# Patient Record
Sex: Female | Born: 1970 | Race: White | Hispanic: No | Marital: Married | Smoking: Never smoker
Health system: Southern US, Community
[De-identification: ages and names within clinical notes are randomized; demographics above are authoritative.]

## PROBLEM LIST (undated history)

## (undated) ENCOUNTER — Inpatient Hospital Stay (HOSPITAL_COMMUNITY): Payer: Self-pay

## (undated) DIAGNOSIS — E669 Obesity, unspecified: Secondary | ICD-10-CM

## (undated) DIAGNOSIS — E069 Thyroiditis, unspecified: Secondary | ICD-10-CM

## (undated) DIAGNOSIS — I10 Essential (primary) hypertension: Secondary | ICD-10-CM

## (undated) DIAGNOSIS — E039 Hypothyroidism, unspecified: Secondary | ICD-10-CM

## (undated) DIAGNOSIS — I493 Ventricular premature depolarization: Secondary | ICD-10-CM

## (undated) DIAGNOSIS — E119 Type 2 diabetes mellitus without complications: Secondary | ICD-10-CM

## (undated) DIAGNOSIS — E282 Polycystic ovarian syndrome: Secondary | ICD-10-CM

## (undated) DIAGNOSIS — I499 Cardiac arrhythmia, unspecified: Secondary | ICD-10-CM

## (undated) DIAGNOSIS — N979 Female infertility, unspecified: Secondary | ICD-10-CM

## (undated) HISTORY — DX: Hypothyroidism, unspecified: E03.9

## (undated) HISTORY — DX: Type 2 diabetes mellitus without complications: E11.9

## (undated) HISTORY — DX: Obesity, unspecified: E66.9

## (undated) HISTORY — PX: WISDOM TOOTH EXTRACTION: SHX21

## (undated) HISTORY — DX: Polycystic ovarian syndrome: E28.2

## (undated) HISTORY — PX: TONSILLECTOMY AND ADENOIDECTOMY: SUR1326

## (undated) HISTORY — DX: Thyroiditis, unspecified: E06.9

## (undated) HISTORY — PX: TUBAL LIGATION: SHX77

## (undated) HISTORY — DX: Female infertility, unspecified: N97.9

## (undated) HISTORY — DX: Cardiac arrhythmia, unspecified: I49.9

## (undated) HISTORY — DX: Essential (primary) hypertension: I10

## (undated) HISTORY — DX: Ventricular premature depolarization: I49.3

---

## 1998-11-13 ENCOUNTER — Encounter: Payer: Self-pay | Admitting: Family Medicine

## 1998-11-13 ENCOUNTER — Ambulatory Visit (HOSPITAL_COMMUNITY): Admission: RE | Admit: 1998-11-13 | Discharge: 1998-11-13 | Payer: Self-pay | Admitting: Family Medicine

## 2003-01-23 ENCOUNTER — Encounter: Payer: Self-pay | Admitting: Family Medicine

## 2003-01-23 ENCOUNTER — Ambulatory Visit (HOSPITAL_COMMUNITY): Admission: RE | Admit: 2003-01-23 | Discharge: 2003-01-23 | Payer: Self-pay | Admitting: Family Medicine

## 2003-07-31 ENCOUNTER — Other Ambulatory Visit: Admission: RE | Admit: 2003-07-31 | Discharge: 2003-07-31 | Payer: Self-pay | Admitting: *Deleted

## 2004-12-22 ENCOUNTER — Emergency Department (HOSPITAL_COMMUNITY): Admission: EM | Admit: 2004-12-22 | Discharge: 2004-12-22 | Payer: Self-pay | Admitting: Emergency Medicine

## 2004-12-26 ENCOUNTER — Ambulatory Visit: Payer: Self-pay | Admitting: Cardiology

## 2005-02-06 ENCOUNTER — Ambulatory Visit: Payer: Self-pay | Admitting: Cardiology

## 2012-04-28 ENCOUNTER — Other Ambulatory Visit: Payer: Self-pay | Admitting: Obstetrics and Gynecology

## 2012-05-04 NOTE — Telephone Encounter (Signed)
Lm on vm for pt.to cb °

## 2012-05-11 ENCOUNTER — Telehealth: Payer: Self-pay | Admitting: Obstetrics and Gynecology

## 2012-05-12 ENCOUNTER — Telehealth: Payer: Self-pay | Admitting: Obstetrics and Gynecology

## 2012-05-12 NOTE — Telephone Encounter (Signed)
TC REGARDING MESSAGE. PT STATES THAT SHE IS LATE FOR CYCLE X 4 DAYS AND PT DID TAKE UPT  AND IT WAS NEG.PT  IS ON PROGESTERONE AND WANT TO KNOW WHAT TO DO. PT HAS APPT WITH SR ON 05/20/12. SPOKE WITH LD  AND PER SR INSTRUCTIONS ARE FOR PT TO WAIT 3 WEEKS AND START RX PROGESTERONE OVER AGAIN. PT IS INSTRUCTED TO KEEP APPT WITH SR AND IF PT HAVE ANY OTHER PROBLEMS TO  GIVE [place] A CALL BACK. PT VOICED UNDERSTANDING.

## 2012-05-20 ENCOUNTER — Ambulatory Visit (INDEPENDENT_AMBULATORY_CARE_PROVIDER_SITE_OTHER): Payer: BC Managed Care – HMO | Admitting: Obstetrics and Gynecology

## 2012-05-20 ENCOUNTER — Encounter: Payer: Self-pay | Admitting: Obstetrics and Gynecology

## 2012-05-20 VITALS — BP 120/76 | Wt 290.0 lb

## 2012-05-20 DIAGNOSIS — N949 Unspecified condition associated with female genital organs and menstrual cycle: Secondary | ICD-10-CM

## 2012-05-20 DIAGNOSIS — N938 Other specified abnormal uterine and vaginal bleeding: Secondary | ICD-10-CM

## 2012-05-20 LAB — FOLLICLE STIMULATING HORMONE: FSH: 3.6 m[IU]/mL

## 2012-05-20 NOTE — Progress Notes (Signed)
Subjective:    Jordan Reeves is a 41 y.o. female, G3P0030, who presents for infertility .Known PCOS with insulin resistance.Had a menstrual delay to 42 days for the first time since using Prometrium 200 mg D16-25 with negative UPT. Has had a change in meds for thyroid without a follow-up. Would like to pursue Clomid induction in January ( 2 previous conceptions with 150 mg with early sab: progesterone level?)    The following portions of the patient's history were reviewed and updated as appropriate: allergies, current medications, past family history.  Review of Systems Pertinent items are noted in HPI.   Objective:    BP 120/76  Wt 290 lb (131.543 kg)  LMP 05/18/2012    Weight:  Wt Readings from Last 1 Encounters:  05/20/12 290 lb (131.543 kg)          BMI: There is no height on file to calculate BMI.  General Appearance: Alert, appropriate appearance for age. No acute distress GYN exam: deferred   Assessment:    Dysfunctional uterine bleeding  in pt known with PCOS   Plan:    Thyroid panel and FSH. Resume Prometrium as prescribed. Follow-up 08/2012     Freida Busman JMD

## 2012-05-21 LAB — THYROID PANEL WITH TSH
Free Thyroxine Index: 4.1 — ABNORMAL HIGH (ref 1.0–3.9)
T3 Uptake: 29.6 % (ref 22.5–37.0)
T4, Total: 13.7 ug/dL — ABNORMAL HIGH (ref 5.0–12.5)
TSH: 2.164 u[IU]/mL (ref 0.350–4.500)

## 2012-05-21 LAB — T4, FREE: Free T4: 1.59 ng/dL (ref 0.80–1.80)

## 2012-10-09 ENCOUNTER — Other Ambulatory Visit: Payer: Self-pay

## 2013-06-30 ENCOUNTER — Other Ambulatory Visit: Payer: Self-pay | Admitting: Obstetrics and Gynecology

## 2013-06-30 ENCOUNTER — Other Ambulatory Visit: Payer: Self-pay

## 2013-06-30 DIAGNOSIS — Z1231 Encounter for screening mammogram for malignant neoplasm of breast: Secondary | ICD-10-CM

## 2013-07-27 ENCOUNTER — Ambulatory Visit
Admission: RE | Admit: 2013-07-27 | Discharge: 2013-07-27 | Disposition: A | Discharge status: Home / Self Care | Payer: BC Managed Care – HMO | Source: Ambulatory Visit | Attending: Obstetrics and Gynecology | Admitting: Obstetrics and Gynecology

## 2013-07-27 DIAGNOSIS — Z1231 Encounter for screening mammogram for malignant neoplasm of breast: Secondary | ICD-10-CM

## 2013-07-29 ENCOUNTER — Other Ambulatory Visit: Payer: Self-pay | Admitting: Obstetrics and Gynecology

## 2013-07-29 DIAGNOSIS — N63 Unspecified lump in unspecified breast: Secondary | ICD-10-CM

## 2013-09-09 ENCOUNTER — Encounter: Payer: Self-pay | Admitting: *Deleted

## 2013-09-09 ENCOUNTER — Encounter: Payer: BC Managed Care – HMO | Attending: Obstetrics and Gynecology | Admitting: *Deleted

## 2013-09-09 DIAGNOSIS — E669 Obesity, unspecified: Secondary | ICD-10-CM

## 2013-09-09 DIAGNOSIS — E282 Polycystic ovarian syndrome: Secondary | ICD-10-CM

## 2013-09-09 DIAGNOSIS — IMO0001 Reserved for inherently not codable concepts without codable children: Secondary | ICD-10-CM | POA: Insufficient documentation

## 2013-09-09 DIAGNOSIS — Z713 Dietary counseling and surveillance: Secondary | ICD-10-CM | POA: Insufficient documentation

## 2013-09-09 DIAGNOSIS — E1165 Type 2 diabetes mellitus with hyperglycemia: Secondary | ICD-10-CM

## 2013-09-09 DIAGNOSIS — E119 Type 2 diabetes mellitus without complications: Secondary | ICD-10-CM

## 2013-09-09 NOTE — Progress Notes (Signed)
  Medical Nutrition Therapy:  Appt start time: 1100 end time:  1200.   Assessment:  Primary concerns today: Jordan Reeves is here for nutrition counseling pertaining to PCOS and diabetes.  Her fertility specialist has recommended a HgAc1 of 6% for fertility treatment.  She has an appointment with an endocrinologist on Monday (Dr. Chalmers Cater).  Her current HgA1c is 7.3%   Preferred Learning Style:   Auditory  Visual   Learning Readiness:   Contemplating   MEDICATIONS: metformin 1000 mg   DIETARY INTAKE:  Usual eating pattern includes 3 meals and 1-2 snacks per day.  Everyday foods include proteins, complex carbohydrates.  Avoided foods include fish, isn't really into vegetables either.    24-hr recall:  B ( AM): 2 oz kefir.  Usually skips  Snk ( AM): not usually  L ( PM): sandwich ([place] with cheese sometimes) oat bread Snk ( PM): apple or clementine D ( PM): chicken, small serving of green beans and more mashed potato.  Is trying to limit breads Snk ( PM): not usually.  Maybe yogurt or cottage cheese with spoonful preserves Beverages: water, coffee sometimes  Usual physical activity: none currently  Estimated energy needs: 1800 calories 200 g carbohydrates 135 g protein 50 g fat  Progress Towards Goal(s):  In progress.   Nutritional Diagnosis:  -2.1 Inpaired nutrition utilization As related to carbohydrates.  As evidenced by uncontrolled diabetes and PCOS.    Intervention:  Nutrition counseling provided.   States why dietary management is important in controlling blood glucose  Describes the effects each nutrient has on blood glucose levels  Demonstrates ability to create a balanced meal plan  Demonstrates carbohydrate counting   States when to check blood glucose levels  States the effect of stress and exercise on blood glucose levels  Aim for 2-3 carb choices/meal.  Increasing vegetable consumption per MyPlate recommendations.  Avoid sugar and concentrated  sweets  Teaching Method Utilized:  Visual Auditory  Handouts given during visit include: Living Well with Diabetes Carb Counting and Food Label handouts Meal Plan Card   Barriers to learning/adherence to lifestyle change: willingness to comply (give up honey and increase vegetables)  Demonstrated degree of understanding via:  Teach Back   Monitoring/Evaluation:  Dietary intake, exercise, BGM, and body weight in 6-8 week(s).

## 2013-09-12 ENCOUNTER — Other Ambulatory Visit: Payer: Self-pay | Admitting: Endocrinology

## 2013-09-12 DIAGNOSIS — E010 Iodine-deficiency related diffuse (endemic) goiter: Secondary | ICD-10-CM

## 2013-09-15 ENCOUNTER — Ambulatory Visit
Admission: RE | Admit: 2013-09-15 | Discharge: 2013-09-15 | Disposition: A | Discharge status: Home / Self Care | Payer: BC Managed Care – HMO | Source: Ambulatory Visit | Attending: Endocrinology | Admitting: Endocrinology

## 2013-09-15 DIAGNOSIS — E010 Iodine-deficiency related diffuse (endemic) goiter: Secondary | ICD-10-CM

## 2013-09-20 ENCOUNTER — Ambulatory Visit
Admission: RE | Admit: 2013-09-20 | Discharge: 2013-09-20 | Disposition: A | Discharge status: Home / Self Care | Payer: BC Managed Care – HMO | Source: Ambulatory Visit | Attending: Obstetrics and Gynecology | Admitting: Obstetrics and Gynecology

## 2013-09-20 DIAGNOSIS — N63 Unspecified lump in unspecified breast: Secondary | ICD-10-CM

## 2013-11-04 ENCOUNTER — Ambulatory Visit: Payer: BC Managed Care – HMO | Admitting: *Deleted

## 2014-06-26 ENCOUNTER — Encounter: Payer: Self-pay | Admitting: *Deleted

## 2014-11-21 ENCOUNTER — Ambulatory Visit (INDEPENDENT_AMBULATORY_CARE_PROVIDER_SITE_OTHER): Payer: BLUE CROSS/BLUE SHIELD | Admitting: Cardiology

## 2014-11-21 ENCOUNTER — Encounter: Payer: Self-pay | Admitting: Cardiology

## 2014-11-21 VITALS — BP 154/98 | HR 61 | Ht 68.0 in | Wt 264.0 lb

## 2014-11-21 DIAGNOSIS — R002 Palpitations: Secondary | ICD-10-CM

## 2014-11-21 DIAGNOSIS — E069 Thyroiditis, unspecified: Secondary | ICD-10-CM | POA: Insufficient documentation

## 2014-11-21 NOTE — Progress Notes (Signed)
Cardiology Office Note   Date:  11/21/2014   ID:  Jordan Reeves, DOB 1971-02-27, MRN [id number]  PCP:  Pcp Not In System  Cardiologist:   Sueanne Margarita, MD   Chief Complaint  Patient presents with  . Palpitations      History of Present Illness: Jordan Reeves is a 44 y.o. female who presents for evaluation of palpitations.  She says that she was pregnant at the time and did not know that she was pregnant when she was having the palpitations.  She says that her BP would run up and down at 169/48mmHg and then would go down to 100/3mmHg.  Her heart beat would go up and down as well.  She would notice that her heart rate was skipping and her BP wrist monitor would read irregular.  She had a few dizzy spells but her sugar was out of wack and may have been due to the pregnancy. She was placed on progesterone about 3 weeks ago and her palpitations drastically improved within a day of going on progesterone. Unfortunately she had a miscarriage on Friday.      Past Medical History  Diagnosis Date  . Hypertension   . Herpes simplex without mention of complication   . Diabetes mellitus without complication   . PCOS (polycystic ovarian syndrome)   . Palpitations   . Infertility, female   . Thyroiditis     Hashimoto's now with hypothyroidism    Past Surgical History  Procedure Laterality Date  . Tonsillectomy and adenoidectomy    . Wisdom tooth extraction       Current Outpatient Prescriptions  Medication Sig Dispense Refill  . BD INSULIN SYRINGE ULTRAFINE 31G X 5/16" 0.3 ML MISC   6  . LEVOXYL 175 MCG tablet Take 175 mcg by mouth daily.  3  . metFORMIN (GLUCOPHAGE-XR) 500 MG 24 hr tablet Take 500 mg by mouth daily.  5  . metoprolol succinate (TOPROL-XL) 100 MG 24 hr tablet Take 100 mg by mouth daily. Pt states that she takes 1 tablet by mouth daily  3  . progesterone (PROMETRIUM) 200 MG capsule Take 200 mg by mouth daily.     No current facility-administered medications for this  visit.    Allergies:   Review of patient's allergies indicates no known allergies.    Social History:  The patient  reports that she has never smoked. She has never used smokeless tobacco. She reports that she does not drink alcohol or use illicit drugs.   Family History:  The patient's family history includes Breast cancer in her maternal aunt; Cervical cancer in her maternal aunt; Diabetes in her father and mother; Heart disease in her father, maternal grandfather, maternal grandmother, mother, and paternal grandfather; Hyperlipidemia in her mother; Hypertension in her mother; Lymphoma in her paternal grandmother.    ROS:  Please see the history of present illness.   Otherwise, review of systems are positive for none.   All other systems are reviewed and negative.    PHYSICAL EXAM: VS:  BP 154/98 mmHg  Pulse 61  Ht 5\' 8"  (1.727 m)  Wt 264 lb (119.75 kg)  BMI 40.15 kg/m2 , BMI Body mass index is 40.15 kg/(m^2). GEN: Well nourished, well developed, in no acute distress HEENT: normal Neck: no JVD, carotid bruits, or masses Cardiac: RRR; no murmurs, rubs, or gallops,no edema  Respiratory:  clear to auscultation bilaterally, normal work of breathing GI: soft, nontender, nondistended, + BS MS: no deformity or atrophy  Skin: warm and dry, no rash Neuro:  Strength and sensation are intact Psych: euthymic mood, full affect   EKG:  EKG is ordered today. The ekg ordered today demonstrates NSR with no ST changes   Recent Labs: No results found for requested labs within last 365 days.    Lipid Panel No results found for: CHOL, TRIG, HDL, CHOLHDL, VLDL, LDLCALC, LDLDIRECT    Wt Readings from Last 3 Encounters:  11/21/14 264 lb (119.75 kg)  05/20/12 290 lb (131.543 kg)        ASSESSMENT AND PLAN:  1.  Palptiations - I will get an event monitor to assess for arrythmias though  they may be hormone related. 2.  HTN - poorly controlled.  She will continue on Toprol for now but I  have asked her to check with her OB as to if she should be on a different BP med if she is actively trying to get pregnant.  I will let her OB and PCP manage her BP since she may be trying to get pregnant again.     Current medicines are reviewed at length with the patient today.  The patient does not have concerns regarding medicines.  The following changes have been made:  no change  Labs/ tests ordered today include: 30 day event monitor   Orders Placed This Encounter  Procedures  . EKG 12-Lead  . Cardiac event monitor     Disposition:   FU with me PRN pending results of studies  Signed, Sueanne Margarita, MD  11/21/2014 8:29 PM    [facility] [address], [place], [place]  [address] Phone: [phone]; Fax: [phone]

## 2014-11-21 NOTE — Patient Instructions (Signed)
Your physician has recommended that you wear an event monitor. Event monitors are medical devices that record the heart's electrical activity. Doctors most often [place] these monitors to diagnose arrhythmias. Arrhythmias are problems with the speed or rhythm of the heartbeat. The monitor is a small, portable device. You can wear one while you do your normal daily activities. This is usually used to diagnose what is causing palpitations/syncope (passing out).  Your physician recommends that you schedule a follow-up appointment AS NEEDED with Dr. Radford Pax pending your monitor results.

## 2014-11-28 ENCOUNTER — Encounter: Payer: Self-pay | Admitting: Radiology

## 2014-11-28 ENCOUNTER — Encounter (INDEPENDENT_AMBULATORY_CARE_PROVIDER_SITE_OTHER): Payer: BLUE CROSS/BLUE SHIELD

## 2014-11-28 DIAGNOSIS — R002 Palpitations: Secondary | ICD-10-CM | POA: Diagnosis not present

## 2014-11-28 NOTE — Progress Notes (Signed)
Patient ID: Jordan Reeves, female   DOB: Jan 17, 1971, 44 y.o.   MRN: [id number] [employer] 30 day monitor applied. EOS 12-27-14

## 2014-12-03 ENCOUNTER — Emergency Department (HOSPITAL_COMMUNITY): Payer: BLUE CROSS/BLUE SHIELD

## 2014-12-03 ENCOUNTER — Emergency Department (HOSPITAL_COMMUNITY)
Admission: EM | Admit: 2014-12-03 | Discharge: 2014-12-03 | Disposition: A | Discharge status: Home / Self Care | Payer: BLUE CROSS/BLUE SHIELD | Attending: Emergency Medicine | Admitting: Emergency Medicine

## 2014-12-03 ENCOUNTER — Other Ambulatory Visit: Payer: Self-pay | Admitting: Physician Assistant

## 2014-12-03 ENCOUNTER — Encounter (HOSPITAL_COMMUNITY): Payer: Self-pay | Admitting: Emergency Medicine

## 2014-12-03 DIAGNOSIS — R0789 Other chest pain: Secondary | ICD-10-CM

## 2014-12-03 DIAGNOSIS — I951 Orthostatic hypotension: Secondary | ICD-10-CM | POA: Diagnosis not present

## 2014-12-03 DIAGNOSIS — Z8742 Personal history of other diseases of the female genital tract: Secondary | ICD-10-CM | POA: Insufficient documentation

## 2014-12-03 DIAGNOSIS — E119 Type 2 diabetes mellitus without complications: Secondary | ICD-10-CM | POA: Insufficient documentation

## 2014-12-03 DIAGNOSIS — Z8639 Personal history of other endocrine, nutritional and metabolic disease: Secondary | ICD-10-CM | POA: Insufficient documentation

## 2014-12-03 DIAGNOSIS — I10 Essential (primary) hypertension: Secondary | ICD-10-CM | POA: Diagnosis not present

## 2014-12-03 DIAGNOSIS — Z79899 Other long term (current) drug therapy: Secondary | ICD-10-CM | POA: Diagnosis not present

## 2014-12-03 DIAGNOSIS — Z3202 Encounter for pregnancy test, result negative: Secondary | ICD-10-CM | POA: Diagnosis not present

## 2014-12-03 DIAGNOSIS — Z8619 Personal history of other infectious and parasitic diseases: Secondary | ICD-10-CM | POA: Diagnosis not present

## 2014-12-03 DIAGNOSIS — R079 Chest pain, unspecified: Secondary | ICD-10-CM | POA: Diagnosis present

## 2014-12-03 LAB — URINE MICROSCOPIC-ADD ON

## 2014-12-03 LAB — BASIC METABOLIC PANEL
ANION GAP: 13 (ref 5–15)
BUN: 12 mg/dL (ref 6–23)
CO2: 20 mmol/L (ref 19–32)
CREATININE: 0.83 mg/dL (ref 0.50–1.10)
Calcium: 9.7 mg/dL (ref 8.4–10.5)
Chloride: 106 mmol/L (ref 96–112)
GFR calc Af Amer: >90 mL/min (ref >90)
GFR, EST NON AFRICAN AMERICAN: 86 mL/min — AB (ref >90)
GLUCOSE: 153 mg/dL — AB (ref 70–99)
POTASSIUM: 4 mmol/L (ref 3.5–5.1)
Sodium: 139 mmol/L (ref 135–145)

## 2014-12-03 LAB — CBC WITH DIFFERENTIAL/PLATELET
Basophils Absolute: 0 10*3/uL (ref 0.0–0.1)
Basophils Relative: 0 % (ref 0–1)
Eosinophils Absolute: 0.2 10*3/uL (ref 0.0–0.7)
Eosinophils Relative: 2 % (ref 0–5)
HCT: 42.1 % (ref 36.0–46.0)
Hemoglobin: 14.5 g/dL (ref 12.0–15.0)
LYMPHS PCT: 18 % (ref 12–46)
Lymphs Abs: 1.8 10*3/uL (ref 0.7–4.0)
MCH: 30.3 pg (ref 26.0–34.0)
MCHC: 34.4 g/dL (ref 30.0–36.0)
MCV: 87.9 fL (ref 78.0–100.0)
MONO ABS: 0.7 10*3/uL (ref 0.1–1.0)
MONOS PCT: 7 % (ref 3–12)
Neutro Abs: 7.2 10*3/uL (ref 1.7–7.7)
Neutrophils Relative %: 73 % (ref 43–77)
Platelets: 302 10*3/uL (ref 150–400)
RBC: 4.79 MIL/uL (ref 3.87–5.11)
RDW: 13.6 % (ref 11.5–15.5)
WBC: 9.9 10*3/uL (ref 4.0–10.5)

## 2014-12-03 LAB — URINALYSIS, ROUTINE W REFLEX MICROSCOPIC
BILIRUBIN URINE: NEGATIVE
Glucose, UA: NEGATIVE mg/dL
Ketones, ur: NEGATIVE mg/dL
Leukocytes, UA: NEGATIVE
Nitrite: NEGATIVE
PH: 5 (ref 5.0–8.0)
Protein, ur: NEGATIVE mg/dL
Specific Gravity, Urine: 1.006 (ref 1.005–1.030)
Urobilinogen, UA: 0.2 mg/dL (ref 0.0–1.0)

## 2014-12-03 LAB — TROPONIN I: Troponin I: <0.03 ng/mL (ref <0.031)

## 2014-12-03 LAB — POC URINE PREG, ED: Preg Test, Ur: NEGATIVE

## 2014-12-03 MED ORDER — SODIUM CHLORIDE 0.9 % IV BOLUS (SEPSIS)
1000.0000 mL | Freq: Once | INTRAVENOUS | Status: AC
  Administered 2014-12-03: 1000 mL via INTRAVENOUS

## 2014-12-03 MED ORDER — GI COCKTAIL ~~LOC~~
30.0000 mL | Freq: Once | ORAL | Status: DC
  Filled 2014-12-03: qty 30

## 2014-12-03 NOTE — ED Notes (Signed)
Pt complaining of heaviness/pressure 2/10 in center of chest. Began about an hour ago. Pt has heart monitor on, from doctors office, apparently had a miscarriage 2 weeks ago. States she has fluttering in chest at times. Recently her doctor switched her bp medication from metoprolol to labetalol. Reports swelling to bilateral feet and hands, no swelling noted on assessment however. Lungs clear. NSR @ 70-80 bpm.

## 2014-12-03 NOTE — Discharge Instructions (Signed)
Chest Pain (Nonspecific) There is no evidence of heart attack. Take your blood pressure medications as prescribed. Followup with Dr. Radford Pax this week. Return to the ED if you develop new or worsening symptoms. It is often hard to give a specific diagnosis for the cause of chest pain. There is always a chance that your pain could be related to something serious, such as a heart attack or a blood clot in the lungs. You need to follow up with your health care provider for further evaluation. CAUSES   Heartburn.  Pneumonia or bronchitis.  Anxiety or stress.  Inflammation around your heart (pericarditis) or lung (pleuritis or pleurisy).  A blood clot in the lung.  A collapsed lung (pneumothorax). It can develop suddenly on its own (spontaneous pneumothorax) or from trauma to the chest.  Shingles infection (herpes zoster virus). The chest wall is composed of bones, muscles, and cartilage. Any of these can be the source of the pain.  The bones can be bruised by injury.  The muscles or cartilage can be strained by coughing or overwork.  The cartilage can be affected by inflammation and become sore (costochondritis). DIAGNOSIS  Lab tests or other studies may be needed to find the cause of your pain. Your health care provider may have you take a test called an ambulatory electrocardiogram (ECG). An ECG records your heartbeat patterns over a 24-hour period. You may also have other tests, such as:  Transthoracic echocardiogram (TTE). During echocardiography, sound waves are used to evaluate how blood flows through your heart.  Transesophageal echocardiogram (TEE).  Cardiac monitoring. This allows your health care provider to monitor your heart rate and rhythm in real time.  Holter monitor. This is a portable device that records your heartbeat and can help diagnose heart arrhythmias. It allows your health care provider to track your heart activity for several days, if needed.  Stress tests by  exercise or by giving medicine that makes the heart beat faster. TREATMENT   Treatment depends on what may be causing your chest pain. Treatment may include:  Acid blockers for heartburn.  Anti-inflammatory medicine.  Pain medicine for inflammatory conditions.  Antibiotics if an infection is present.  You may be advised to change lifestyle habits. This includes stopping smoking and avoiding alcohol, caffeine, and chocolate.  You may be advised to keep your head raised (elevated) when sleeping. This reduces the chance of acid going backward from your stomach into your esophagus. Most of the time, nonspecific chest pain will improve within 2-3 days with rest and mild pain medicine.  HOME CARE INSTRUCTIONS   If antibiotics were prescribed, take them as directed. Finish them even if you start to feel better.  For the next few days, avoid physical activities that bring on chest pain. Continue physical activities as directed.  Do not use any tobacco products, including cigarettes, chewing tobacco, or electronic cigarettes.  Avoid drinking alcohol.  Only take medicine as directed by your health care provider.  Follow your health care provider's suggestions for further testing if your chest pain does not go away.  Keep any follow-up appointments you made. If you do not go to an appointment, you could develop lasting (chronic) problems with pain. If there is any problem keeping an appointment, call to reschedule. SEEK MEDICAL CARE IF:   Your chest pain does not go away, even after treatment.  You have a rash with blisters on your chest.  You have a fever. SEEK IMMEDIATE MEDICAL CARE IF:   You  have increased chest pain or pain that spreads to your arm, neck, jaw, back, or abdomen.  You have shortness of breath.  You have an increasing cough, or you cough up blood.  You have severe back or abdominal pain.  You feel nauseous or vomit.  You have severe weakness.  You  faint.  You have chills. This is an emergency. Do not wait to see if the pain will go away. Get medical help at once. Call your local emergency services (911 in [place] Do not drive yourself to the hospital. MAKE SURE YOU:   Understand these instructions.  Will watch your condition.  Will get help right away if you are not doing well or get worse. Document Released: 05/21/2005 Document Revised: 08/16/2013 Document Reviewed: 03/16/2008 [facility] Patient Information 2015 [place], [place]. This information is not intended to replace advice given to you by your health care provider. Make sure you discuss any questions you have with your health care provider.

## 2014-12-03 NOTE — ED Notes (Signed)
Pt tolerating ginger ale well 

## 2014-12-03 NOTE — ED Notes (Signed)
Cardiology at bedside.

## 2014-12-03 NOTE — ED Notes (Signed)
Pt. Stated, they changed my BP meds. Because of as pregnancy and a miscarriage I had 2 weeks ago.  30 minutes ago I started having chest pain.

## 2014-12-03 NOTE — ED Notes (Signed)
Pt voicing concern that her bp elevates when she lies down and her "heart goes crazy." Pt bp also elevates when she sits up. Pt complaining of headache at this time. Also refused GI cocktail.

## 2014-12-03 NOTE — ED Provider Notes (Signed)
CSN: [id number]     Arrival date & time 12/03/14  4166 History   First MD Initiated Contact with Patient 12/03/14 [phone]     Chief Complaint  Patient presents with  . Chest Pain     (Consider location/radiation/quality/duration/timing/severity/associated sxs/prior Treatment) HPI Comments: Patient reports episode of central chest heaviness and pain that onset while she was sitting in the car. Pain radiated to her neck. It resolved spontaneously after about 30 minutes. She has never had pain like this in the past. She believes it is related to her blood pressure medication recently being changed from metoprolol to labetalol. She states she's been compliant with the medications. She was switched because there was a possibility she was trying to get pregnant again. She reports her blood pressure was over 200 home and she was having palpitations and feeling like she is going to pass out. She took her medication as prescribed. She's never had pain like this in her chest previously. She's never had a stress test. She is currently wearing an event monitor. She feels back to baseline now except for mild headache. Denies any diaphoresis but she did have nausea and shortness of breath. She denies any abdominal pain or vomiting.  The history is provided by the patient.    Past Medical History  Diagnosis Date  . Hypertension   . Herpes simplex without mention of complication   . Diabetes mellitus without complication   . PCOS (polycystic ovarian syndrome)   . Palpitations   . Infertility, female   . Thyroiditis     Hashimoto's now with hypothyroidism   Past Surgical History  Procedure Laterality Date  . Tonsillectomy and adenoidectomy    . Wisdom tooth extraction     Family History  Problem Relation Age of Onset  . Heart disease Paternal Grandfather   . Lymphoma Paternal Grandmother   . Heart disease Maternal Grandmother   . Heart disease Maternal Grandfather   . Heart disease Father   .  Diabetes Father   . Diabetes Mother   . Hypertension Mother   . Heart disease Mother     heart murmu  . Hyperlipidemia Mother   . Breast cancer Maternal Aunt   . Cervical cancer Maternal Aunt    History  Substance Use Topics  . Smoking status: Never Smoker   . Smokeless tobacco: Never Used  . Alcohol Use: No   OB History    Gravida Para Term Preterm AB TAB SAB Ectopic Multiple Living   3    3  3    0     Review of Systems  Constitutional: Negative for fever, activity change and appetite change.  HENT: Negative for congestion and rhinorrhea.   Eyes: Negative for visual disturbance.  Respiratory: Positive for chest tightness and shortness of breath. Negative for cough.   Cardiovascular: Positive for chest pain.  Gastrointestinal: Negative for nausea, vomiting and abdominal pain.  Genitourinary: Negative for dysuria, hematuria, vaginal bleeding and vaginal discharge.  Musculoskeletal: Negative for myalgias and arthralgias.  Skin: Negative for rash.  Neurological: Negative for dizziness and headaches.  A complete 10 system review of systems was obtained and all systems are negative except as noted in the HPI and PMH.      Allergies  Review of patient's allergies indicates no known allergies.  Home Medications   Prior to Admission medications   Medication Sig Start Date End Date Taking? Authorizing Provider  aspirin 325 MG tablet Take 325 mg by mouth every 6 (six)  hours as needed for mild pain or moderate pain (chest pain).   Yes Historical Provider, MD  labetalol (NORMODYNE) 200 MG tablet Take 200 mg by mouth 2 (two) times daily. 11/30/14  Yes Historical Provider, MD  LEVOXYL 175 MCG tablet Take 175 mcg by mouth daily. 10/28/14  Yes Historical Provider, MD  metFORMIN (GLUCOPHAGE-XR) 500 MG 24 hr tablet Take 500 mg by mouth daily. 10/25/14  Yes Historical Provider, MD  Omega-3 Fatty Acids (OMEGA 3 PO) Take 1 tablet by mouth daily at 12 noon.   Yes Historical Provider, MD  Prenatal  Vit-Fe Fumarate-FA (PRENATAL MULTIVITAMIN) TABS tablet Take 1 tablet by mouth daily at 12 noon.   Yes Historical Provider, MD  progesterone (PROMETRIUM) 200 MG capsule Take 200 mg by mouth daily.   Yes Historical Provider, MD  terbinafine (LAMISIL) 250 MG tablet Take 250 mg by mouth daily at 12 noon. 11/23/14  Yes Historical Provider, MD   BP 137/64 mmHg  Pulse 78  Temp(Src) 98.3 F (36.8 C) (Oral)  Resp 18  Ht 5\' 8"  (1.727 m)  Wt 262 lb (118.842 kg)  BMI 39.85 kg/m2  SpO2 96%  LMP 09/14/2014 Physical Exam  Constitutional: She is oriented to person, place, and time. She appears well-developed and well-nourished. No distress.  HENT:  Head: Normocephalic and atraumatic.  Mouth/Throat: Oropharynx is clear and moist. No oropharyngeal exudate.  Eyes: Conjunctivae and EOM are normal. Pupils are equal, round, and reactive to light.  Neck: Normal range of motion. Neck supple.  No meningismus.  Cardiovascular: Normal rate, regular rhythm, normal heart sounds and intact distal pulses.   No murmur heard. Pulmonary/Chest: Effort normal and breath sounds normal. No respiratory distress.  Abdominal: Soft. There is no tenderness. There is no rebound and no guarding.  Musculoskeletal: Normal range of motion. She exhibits no edema or tenderness.  Neurological: She is alert and oriented to person, place, and time. No cranial nerve deficit. She exhibits normal muscle tone. Coordination normal.  No ataxia on finger to nose bilaterally. No pronator drift. 5/5 strength throughout. CN 2-12 intact. Negative Romberg. Equal grip strength. Sensation intact. Gait is normal.   Skin: Skin is warm.  Psychiatric: She has a normal mood and affect. Her behavior is normal.  Nursing note and vitals reviewed.   ED Course  Procedures (including critical care time) Labs Review Labs Reviewed  URINALYSIS, ROUTINE W REFLEX MICROSCOPIC - Abnormal; Notable for the following:    Hgb urine dipstick TRACE (*)    All other  components within normal limits  BASIC METABOLIC PANEL - Abnormal; Notable for the following:    Glucose, Bld 153 (*)    GFR calc non Af Amer 86 (*)    All other components within normal limits  CBC WITH DIFFERENTIAL/PLATELET  TROPONIN I  URINE MICROSCOPIC-ADD ON  TROPONIN I  POC URINE PREG, ED    Imaging Review Dg Chest 2 View  12/03/2014   CLINICAL DATA:  44 year old female with hypertension, heart palpitations, shortness of breath since medication change on Thursday. Recent miscarriage. Initial encounter.  EXAM: CHEST  2 VIEW  COMPARISON:  None.  FINDINGS: Mild elevation of the right hemidiaphragm. Lung volumes within normal limits. Normal cardiac size and mediastinal contours. Visualized tracheal air column is within normal limits. No pneumothorax, pulmonary edema, pleural effusion or confluent pulmonary opacity. No acute osseous abnormality identified.  IMPRESSION: No acute cardiopulmonary abnormality.   Electronically Signed   By: Genevie Ann M.D.   On: 12/03/2014 09:19  EKG Interpretation   Date/Time:  Sunday December 03 2014 08:21:48 EDT Ventricular Rate:  86 PR Interval:  138 QRS Duration: 78 QT Interval:  390 QTC Calculation: 466 R Axis:   77 Text Interpretation:  Normal sinus rhythm Cannot rule out Anterior infarct  , age undetermined Abnormal ECG No previous ECGs available Confirmed by  Zaydah Nawabi  MD, [facility] [phone]) on 12/03/2014 8:31:53 AM      MDM   Final diagnoses:  Orthostasis  Atypical chest pain   Episode of central chest pain with shortness of breath and nausea. Now resolved. EKG is unchanged from previous.  Labs with troponin, chest xray.  Troponin negative. Pain has resolved.  Assessment Dr. Meda Coffee of cardiology who will arrange for patient to have outpatient stress test. She will call patient tomorrow. She recommends second troponin in the ED.  Second troponin negative. Patient orthostatic with elevated heart rate to 120 with standing. Patient insisting  on seeing cardiology before she leaves.  Dr. Meda Coffee saw patient and stated she could take 400 mg of labetalol tonight. She has follow-up with Dr. Radford Pax tomorrow. She has an unchanged EKG and 2 negative troponins. She is given IV and by mouth fluids in the ED. She'll keep a blood pressure log and follow up with cardiology for a stress test as scheduled. Return precautions discussed.   Ezequiel Essex, MD 12/03/14 [phone]

## 2014-12-06 ENCOUNTER — Ambulatory Visit (INDEPENDENT_AMBULATORY_CARE_PROVIDER_SITE_OTHER): Payer: BLUE CROSS/BLUE SHIELD | Admitting: Cardiology

## 2014-12-06 ENCOUNTER — Encounter (HOSPITAL_COMMUNITY): Payer: BLUE CROSS/BLUE SHIELD

## 2014-12-06 ENCOUNTER — Encounter: Payer: Self-pay | Admitting: Cardiology

## 2014-12-06 VITALS — BP 168/92 | HR 76 | Ht 68.0 in | Wt 261.2 lb

## 2014-12-06 DIAGNOSIS — R079 Chest pain, unspecified: Secondary | ICD-10-CM

## 2014-12-06 NOTE — Progress Notes (Signed)
Cardiology Office Note   Date:  12/06/2014   ID:  Jordan Reeves, DOB 24-Oct-1970, MRN [id number]  PCP:  Pcp Not In System  Cardiologist:  Dr. Radford Pax    Chief Complaint  Patient presents with  . post hospital    patient is currently wearing a heart monitor. has been on for 1 week.      History of Present Illness: Jordan Reeves is a 44 y.o. female who presents for follow up after previous visit and ER visit for chest pain and HTN.   Was recently seen by Dr. Radford Pax and due to the fact the patient is trying to get pregnant her Toprol was stopped and she was placed on labetalol. She does have a history of polycystic ovarian syndrome.  She recently had been having increased palpitations described as skipped beats and racing heart rate. With the change the labetalol to 200 mg twice a day she developed chest pain irregular heartbeat and hypertension with systolic pressure greater than 200 and was seen in the emergency room on April 10.  She recently was pregnant and miscarried on March 31.  She would like to get pregnant again but she is not going to use fertility treatment to do so.    She says that her BP runs up and down at 169/68mmHg and then would go down to 100/24mmHg. Her heart beat would go up and down as well. She would notice that her heart rate was skipping and her BP wrist monitor would read irregular. She was placed on progesterone about 3 weeks prior to visit with Dr. Radford Pax and her palpitations drastically improved within a day of going on progesterone.    Was seen in the ER 12/03/14 for chest pain occurring with elevated BP with systolic > 409.  Associated with nausea and SOB.  troponins neg and EKG stable.  She was given IV fluids as well.  The case was discussed with Dr. Meda Coffee who felt stress test should be done as well as increasing her labetalol to 400 mg twice a day. Her meds were increased and she has seen primary care back with increased it to 400 mg 3 times a day.  She'll  occasionally have some mild chest discomfort but nothing like it was on ER visit her blood pressures has not been as elevated either.   She does have premature family history of coronary artery disease with a father who died at 43 with an MI and uncle it died of 58 of an MI. Maternal grandmother also had coronary disease.  Past Medical History  Diagnosis Date  . Hypertension   . Herpes simplex without mention of complication   . Diabetes mellitus without complication   . PCOS (polycystic ovarian syndrome)   . Palpitations   . Infertility, female   . Thyroiditis     Hashimoto's now with hypothyroidism    Past Surgical History  Procedure Laterality Date  . Tonsillectomy and adenoidectomy    . Wisdom tooth extraction       Current Outpatient Prescriptions  Medication Sig Dispense Refill  . labetalol (NORMODYNE) 200 MG tablet Take 400 mg by mouth 3 (three) times daily.    Marland Kitchen LEVOXYL 175 MCG tablet Take 175 mcg by mouth daily.  3  . metFORMIN (GLUCOPHAGE-XR) 500 MG 24 hr tablet Take 500 mg by mouth daily.  5  . Prenatal Vit-Fe Fumarate-FA (PRENATAL MULTIVITAMIN) TABS tablet Take 1 tablet by mouth daily at 12 noon.    . progesterone ([place])  200 MG capsule Take 200 mg by mouth daily.     No current facility-administered medications for this visit.    Allergies:   Review of patient's allergies indicates no known allergies.    Social History:  The patient  reports that she has never smoked. She has never used smokeless tobacco. She reports that she does not drink alcohol or use illicit drugs.   Family History:  The patient's family history includes Breast cancer in her maternal aunt; Cervical cancer in her maternal aunt; Diabetes in her father and mother; Heart disease in her father, maternal grandfather, maternal grandmother, mother, and paternal grandfather; Hyperlipidemia in her mother; Hypertension in her mother; Lymphoma in her paternal grandmother.    ROS:  General:no colds  or fevers, no weight changes Skin:no rashes or ulcers HEENT:+ blurred vision when her BP is low, no congestion CV:see HPI PUL:see HPI GI:no diarrhea constipation or melena, no indigestion GU:no hematuria, no dysuria MS:no joint pain, no claudication Neuro:no syncope, no lightheadedness Endo:+ diabetes, + thyroid disease  Wt Readings from Last 3 Encounters:  12/06/14 261 lb 3.2 oz (118.48 kg)  12/03/14 262 lb (118.842 kg)  11/21/14 264 lb (119.75 kg)     PHYSICAL EXAM: VS:  BP 168/92 mmHg  Pulse 76  Ht 5\' 8"  (1.727 m)  Wt 261 lb 3.2 oz (118.48 kg)  BMI 39.72 kg/m2  LMP 09/14/2014 , BMI Body mass index is 39.72 kg/(m^2). General:Pleasant affect, NAD Skin:Warm and dry, brisk capillary refill HEENT:normocephalic, sclera clear, mucus membranes moist Neck:supple, no JVD, no bruits  Heart:S1S2 RRR without murmur, gallup, rub or click Lungs:clear without rales, rhonchi, or wheezes WUJ:WJXBJ, soft, non tender, + BS, do not palpate liver spleen or masses Ext:no lower ext edema, 2+ pedal pulses, 2+ radial pulses Neuro:alert and oriented, MAE, follows commands, + facial symmetry GYN:  Last menses was in January but with recent miscarriage she has had episodes of passing clots most recently this past Saturday the 10th.     EKG:  EKG is NOT ordered today. Reviewed EKG 14 2016 sinus rhythm without acute changes   Recent Labs: 12/03/2014: BUN 12; Creatinine 0.83; Hemoglobin 14.5; Platelets 302; Potassium 4.0; Sodium 139    Lipid Panel No results found for: CHOL, TRIG, HDL, CHOLHDL, VLDL, LDLCALC, LDLDIRECT     Other studies Reviewed: Additional studies/ records that were reviewed today include: er notes,. Dr Radford Pax notes   ASSESSMENT AND PLAN:  1.  Chest pain, pressure feeling with nausea and SOB with elevated BP.  She has hx or premature CAD.  Will do ETT once the labetalol has been in her system.  She will take the night before the study but hold the am dose.  I discussed with  Dr. Debara Pickett and while her HR may not increase we do think it will as on event monitor HR 108 with sitting.  2. Palpitations. Has event monitor, no acute arrhthymias some mild ST.   She has more palpitations than chest discomfort.  3.  Labile BP if BP begins to decrease with higher dose she will call and we will decrease.  4.  Plan to try for pregnancy.  Have asked not to try for 1-2 weeks.  She will follow up with Dr. Radford Pax.      Current medicines are reviewed with the patient today.  The patient Has no concerns regarding medicines.  The following changes have been made:  See above Labs/ tests ordered today include:see above  Disposition:   FU:  see above  Lennie Muckle, NP  12/06/2014 4:20 PM    [facility] [place], [place], [place], [place] Phone: [phone]; Fax: [phone]

## 2014-12-06 NOTE — Patient Instructions (Signed)
Your physician has requested that you have an exercise tolerance test.MUST BE A MORNING APPOINTMENT.  For further information please visit [email or web address]. Please also follow instruction sheet, as given.  HOLD YOU LABETOLOL ONLY THE MORNING OF THE STRESS TEST.  Have stress test week after next.  If your BP becomes low call us and we would adjust BP meds.  Try not to get pregnant until after stress test.      Your physician recommends that you schedule a follow-up appointment in: The week after the stress test with Dr. Radford Pax for test results.

## 2014-12-08 ENCOUNTER — Telehealth: Payer: Self-pay | Admitting: Cardiology

## 2014-12-08 NOTE — Telephone Encounter (Signed)
Jordan Reeves - I called Jordan Reeves to tell her time, etc for her GXT and follow up with Dr. Radford Pax.  She has decided that she does not want to have it now, does not feel like she is stable enough.  However, she says she is able to be up and around now unlike when she was in the hospital.  She has appt with Dr. Radford Pax on May 2 and will talk to her about the treadmill test...Marland KitchenMarland Kitchen

## 2014-12-08 NOTE — Telephone Encounter (Signed)
Ok I will pass on to  Dr. Radford Pax

## 2014-12-18 ENCOUNTER — Encounter (HOSPITAL_COMMUNITY): Payer: BLUE CROSS/BLUE SHIELD

## 2014-12-25 ENCOUNTER — Ambulatory Visit: Payer: BLUE CROSS/BLUE SHIELD | Admitting: Cardiology

## 2015-01-05 ENCOUNTER — Telehealth: Payer: Self-pay | Admitting: Cardiology

## 2015-01-05 NOTE — Telephone Encounter (Signed)
Informed patient of results and verbal understanding expressed.  

## 2015-01-05 NOTE — Telephone Encounter (Signed)
Please let patient know that heart monitor showed NSR with rare PVC which are benign

## 2015-01-05 NOTE — Telephone Encounter (Signed)
Left message to call back  

## 2015-01-24 NOTE — Progress Notes (Signed)
Cardiology Office Note   Date:  01/25/2015   ID:  Jordan Reeves, DOB 09/13/1970, MRN [id number]  PCP:  Pcp Not In System    Chief Complaint  Patient presents with  . Follow-up    chest pain      History of Present Illness: Jordan Reeves is a 44 y.o. female who presents for follow up after previous visit and ER visit for chest pain and HTN.   She was seen by me a few months ago and due to the fact the patient was trying to get pregnant her Toprol was stopped and she was placed on labetalol. She does have a history of polycystic ovarian syndrome. She recently had been having increased palpitations described as skipped beats and racing heart rate. With the change the labetalol to 200 mg twice a day she developed chest pain with  irregular heartbeat and hypertension with systolic pressure greater than 200.  Was seen in the ER 12/03/14 for chest pain occurring with elevated BP with systolic > 229. Associated with nausea and SOB. troponins neg and EKG stable. She was given IV fluids as well. The case was discussed with Dr. Meda Coffee who felt stress test should be done as well as increasing her labetalol to 400 mg twice a day. Her meds were increased and she has seen primary care back with increased it to 400 mg 3 times a day.   She does have premature family history of coronary artery disease with a father who died at 56 with an MI and uncle it died of 75 of an MI. Maternal grandmother also had coronary disease.  Her heart monitor showed no acute abnormalities except for mild sinus tachycardia and rare PVC.  She was set up for ETT but cancelled it. She presents back today doing well.  She says that she is feeling much better.  She has had very little discomfort in her chest that she said was mainly palpitations. She had some heaviness in her chest around the time her BP meds were changed but that has resolved after getting her BP controlled.  She will get the pressure if she  misses a dose or is late on a dose of her BB.  She has backed off on her Labetolol due to feeling wiped out and is now taking Labetolol 300mg  TID.  She has some intermittent LE edema.    Past Medical History  Diagnosis Date  . Hypertension   . Herpes simplex without mention of complication   . Diabetes mellitus without complication   . PCOS (polycystic ovarian syndrome)   . Palpitations   . Infertility, female   . Thyroiditis     Hashimoto's now with hypothyroidism  . PVCs (premature ventricular contractions) 01/25/2015    Past Surgical History  Procedure Laterality Date  . Tonsillectomy and adenoidectomy    . Wisdom tooth extraction       Current Outpatient Prescriptions  Medication Sig Dispense Refill  . labetalol (NORMODYNE) 200 MG tablet Take 400 mg by mouth 3 (three) times daily.    Marland Kitchen LEVOXYL 175 MCG tablet Take 175 mcg by mouth daily.  3  . Prenatal Vit-Fe Fumarate-FA (PRENATAL MULTIVITAMIN) TABS tablet Take 1 tablet by mouth daily at 12 noon.    . progesterone (PROMETRIUM) 200 MG capsule Take 200 mg by mouth daily. Pt takes 1 capsule by mouth starting on day 13-26.    Marland Kitchen  metFORMIN (GLUCOPHAGE-XR) 500 MG 24 hr tablet Take 500 mg by mouth daily.  5   No current facility-administered medications for this visit.    Allergies:   Review of patient's allergies indicates no known allergies.    Social History:  The patient  reports that she has never smoked. She has never used smokeless tobacco. She reports that she does not drink alcohol or use illicit drugs.   Family History:  The patient's family history includes Breast cancer in her maternal aunt; Cervical cancer in her maternal aunt; Diabetes in her father and mother; Heart disease in her father, maternal grandfather, maternal grandmother, mother, and paternal grandfather; Hyperlipidemia in her mother; Hypertension in her mother; Lymphoma in her paternal grandmother.    ROS:  Please see the history of present illness.    Otherwise, review of systems are positive for none.   All other systems are reviewed and negative.    PHYSICAL EXAM: VS:  BP 140/90 mmHg  Pulse 75  Ht 5\' 8"  (1.727 m)  Wt 262 lb 12.8 oz (119.205 kg)  BMI 39.97 kg/m2  SpO2 97%  LMP 01/12/2015 , BMI Body mass index is 39.97 kg/(m^2). GEN: Well nourished, well developed, in no acute distress HEENT: normal Neck: no JVD, carotid bruits, or masses Cardiac: RRR; no murmurs, rubs, or gallops,no edema  Respiratory:  clear to auscultation bilaterally, normal work of breathing GI: soft, nontender, nondistended, + BS MS: no deformity or atrophy Skin: warm and dry, no rash Neuro:  Strength and sensation are intact Psych: euthymic mood, full affect   EKG:  EKG is not ordered today.    Recent Labs: 12/03/2014: BUN 12; Creatinine 0.83; Hemoglobin 14.5; Platelets 302; Potassium 4.0; Sodium 139    Lipid Panel No results found for: CHOL, TRIG, HDL, CHOLHDL, VLDL, LDLCALC, LDLDIRECT    Wt Readings from Last 3 Encounters:  01/25/15 262 lb 12.8 oz (119.205 kg)  12/06/14 261 lb 3.2 oz (118.48 kg)  12/03/14 262 lb (118.842 kg)    ASSESSMENT AND PLAN:  1. Chest pain, pressure feeling with nausea and SOB with elevated BP. She has hx or premature CAD. I will get an ETT to rule out ischemia.  She will hold her Labetolol the am of the study.   2. Palpitations. Event monitor showed PVC's which I reassured her were normal.   3. HTN well controlled on current meds 4.  PVC's - I have reassured her that these are benign.  I will get a 2D echo to evaluate for structural heart disease.  Current medicines are reviewed at length with the patient today.  The patient does not have concerns regarding medicines.  The following changes have been made:  no change  Labs/ tests ordered today: See above Assessment and Plan  Orders Placed This Encounter  Procedures  . Exercise Tolerance Test  . Echocardiogram     Disposition:   FU with me in 1  year  Signed, Sueanne Margarita, MD  01/25/2015 9:33 AM    [facility] [place], [place], [place]  [address] Phone: [phone]; Fax: [phone]

## 2015-01-25 ENCOUNTER — Encounter: Payer: Self-pay | Admitting: Cardiology

## 2015-01-25 ENCOUNTER — Ambulatory Visit (INDEPENDENT_AMBULATORY_CARE_PROVIDER_SITE_OTHER): Payer: BLUE CROSS/BLUE SHIELD | Admitting: Cardiology

## 2015-01-25 VITALS — BP 140/90 | HR 75 | Ht 68.0 in | Wt 262.8 lb

## 2015-01-25 DIAGNOSIS — R079 Chest pain, unspecified: Secondary | ICD-10-CM

## 2015-01-25 DIAGNOSIS — I10 Essential (primary) hypertension: Secondary | ICD-10-CM | POA: Diagnosis not present

## 2015-01-25 DIAGNOSIS — I493 Ventricular premature depolarization: Secondary | ICD-10-CM | POA: Diagnosis not present

## 2015-01-25 HISTORY — DX: Ventricular premature depolarization: I49.3

## 2015-01-25 HISTORY — DX: Essential (primary) hypertension: I10

## 2015-01-25 NOTE — Patient Instructions (Addendum)
Medication Instructions:  Your physician recommends that you continue on your current medications as directed. Please refer to the Current Medication list given to you today.   Labwork: None  Testing/Procedures: Your physician has requested that you have an echocardiogram. Echocardiography is a painless test that uses sound waves to create images of your heart. It provides your doctor with information about the size and shape of your heart and how well your heart's chambers and valves are working. This procedure takes approximately one hour. There are no restrictions for this procedure.  Your physician has requested that you have an exercise tolerance test (earliest appointment of the day available). For further information please visit [email or web address]. Please also follow instruction sheet, as given.  Follow-Up: Your physician wants you to follow-up in: 1 year with Dr. Radford Pax. You will receive a reminder letter in the mail two months in advance. If you don't receive a letter, please call our office to schedule the follow-up appointment.   Any Other Special Instructions Will Be Listed Below (If Applicable).

## 2015-01-25 NOTE — Addendum Note (Signed)
Addended by: Harland German A on: 01/25/2015 09:43 AM   Modules accepted: Orders, Medications

## 2015-01-31 ENCOUNTER — Encounter: Payer: Self-pay | Admitting: Cardiology

## 2015-02-02 ENCOUNTER — Other Ambulatory Visit: Payer: Self-pay

## 2015-02-02 ENCOUNTER — Ambulatory Visit (HOSPITAL_COMMUNITY): Payer: BLUE CROSS/BLUE SHIELD | Attending: Internal Medicine

## 2015-02-02 DIAGNOSIS — I493 Ventricular premature depolarization: Secondary | ICD-10-CM

## 2015-02-02 DIAGNOSIS — I071 Rheumatic tricuspid insufficiency: Secondary | ICD-10-CM | POA: Diagnosis not present

## 2015-02-02 DIAGNOSIS — R079 Chest pain, unspecified: Secondary | ICD-10-CM | POA: Diagnosis present

## 2015-02-06 ENCOUNTER — Telehealth: Payer: Self-pay | Admitting: Cardiology

## 2015-02-06 DIAGNOSIS — I493 Ventricular premature depolarization: Secondary | ICD-10-CM

## 2015-02-06 DIAGNOSIS — R079 Chest pain, unspecified: Secondary | ICD-10-CM

## 2015-02-06 NOTE — Telephone Encounter (Signed)
error 

## 2015-02-09 NOTE — Telephone Encounter (Signed)
-----   Message from Sueanne Margarita, MD sent at 02/06/2015  7:18 AM EDT ----- She will need to be changed to a Lexiscan myovew if she refused to hold her BB

## 2015-02-09 NOTE — Telephone Encounter (Signed)
Patient agrees to [employer]. Reviewed myoview instructions and stress test ordered for scheduling. GXT cancelled. Patient agrees with treatment plan.

## 2015-02-19 ENCOUNTER — Other Ambulatory Visit: Payer: Self-pay

## 2015-02-21 ENCOUNTER — Telehealth (HOSPITAL_COMMUNITY): Payer: Self-pay | Admitting: *Deleted

## 2015-02-21 NOTE — Telephone Encounter (Signed)
Patient given detailed instructions per Myocardial Perfusion Study Information Sheet for test on 02/27/15 at 11:45. Patient Notified to arrive 15 minutes early, and that it is imperative to arrive on time for appointment to keep from having the test rescheduled. Patient verbalized understanding. Hubbard Robinson, RN

## 2015-02-27 ENCOUNTER — Ambulatory Visit (HOSPITAL_COMMUNITY): Payer: BLUE CROSS/BLUE SHIELD | Attending: Cardiology

## 2015-02-27 DIAGNOSIS — I493 Ventricular premature depolarization: Secondary | ICD-10-CM | POA: Insufficient documentation

## 2015-02-27 DIAGNOSIS — R079 Chest pain, unspecified: Secondary | ICD-10-CM | POA: Diagnosis not present

## 2015-02-27 MED ORDER — TECHNETIUM TC 99M SESTAMIBI GENERIC - CARDIOLITE
31.9000 | Freq: Once | INTRAVENOUS | Status: AC | PRN
  Administered 2015-02-27: 31.9 via INTRAVENOUS

## 2015-02-27 MED ORDER — REGADENOSON 0.4 MG/5ML IV SOLN
0.4000 mg | Freq: Once | INTRAVENOUS | Status: AC
  Administered 2015-02-27: 0.4 mg via INTRAVENOUS

## 2015-02-28 ENCOUNTER — Ambulatory Visit (HOSPITAL_COMMUNITY): Payer: BLUE CROSS/BLUE SHIELD | Attending: Cardiovascular Disease

## 2015-02-28 LAB — MYOCARDIAL PERFUSION IMAGING
CHL CUP NUCLEAR SRS: 1
CHL CUP RESTING HR STRESS: 83 {beats}/min
LV dias vol: 111 mL
LV sys vol: 37 mL
NUC STRESS TID: 0.93
Peak HR: 129 {beats}/min
RATE: 0.28
SDS: 0
SSS: 1

## 2015-02-28 MED ORDER — TECHNETIUM TC 99M SESTAMIBI GENERIC - CARDIOLITE
32.4000 | Freq: Once | INTRAVENOUS | Status: AC | PRN
  Administered 2015-02-28: 32 via INTRAVENOUS

## 2015-03-01 ENCOUNTER — Ambulatory Visit: Payer: BLUE CROSS/BLUE SHIELD | Admitting: Physician Assistant

## 2015-10-19 IMAGING — NM NM MYOCAR MULTI W/ SPECT
3 series · 18 of 18 positions shown · non-contrast
Comparison: none

[Series 1: rest-mc_(id)_sa · 6.5mm · 6.51mm/px · 6 of 64 frames shown]
[frame 6/64]
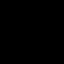
[frame 16/64]
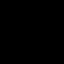
[frame 27/64]
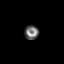
[frame 38/64]
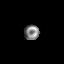
[frame 48/64]
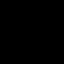
[frame 59/64]
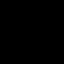

[Series 1: stress_(id)_sa · 6.5mm · 6.51mm/px · 6 of 64 frames shown (1 of 2)]
[frame 6/64]
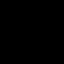
[frame 16/64]
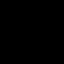
[frame 27/64]
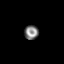
[frame 38/64]
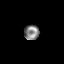
[frame 48/64]
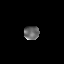
[frame 59/64]
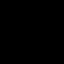

[Series 1: stress_(id)_sa · 6.5mm · 6.51mm/px · 6 of 512 frames shown (2 of 2)]
[frame 43/512]
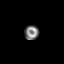
[frame 128/512]
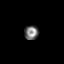
[frame 214/512]
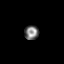
[frame 299/512]
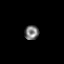
[frame 384/512]
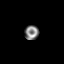
[frame 470/512]
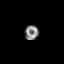

[18 of 18 positions shown; findings below may reference images not displayed]

Canned report from images found in remote index.

Refer to host system for actual result text.

## 2016-02-05 ENCOUNTER — Encounter: Payer: Self-pay | Admitting: Cardiology

## 2016-02-05 ENCOUNTER — Ambulatory Visit (INDEPENDENT_AMBULATORY_CARE_PROVIDER_SITE_OTHER): Payer: BLUE CROSS/BLUE SHIELD | Admitting: Cardiology

## 2016-02-05 VITALS — BP 134/82 | HR 78 | Ht 68.0 in | Wt 262.0 lb

## 2016-02-05 DIAGNOSIS — I493 Ventricular premature depolarization: Secondary | ICD-10-CM | POA: Diagnosis not present

## 2016-02-05 DIAGNOSIS — I10 Essential (primary) hypertension: Secondary | ICD-10-CM | POA: Diagnosis not present

## 2016-02-05 DIAGNOSIS — E669 Obesity, unspecified: Secondary | ICD-10-CM

## 2016-02-05 HISTORY — DX: Obesity, unspecified: E66.9

## 2016-02-05 NOTE — Patient Instructions (Signed)

## 2016-02-05 NOTE — Progress Notes (Signed)
Cardiology Office Note    Date:  02/06/2016   ID:  [name], DOB 04-29-1971, MRN [id number]  PCP:  Rory Percy, MD  Cardiologist:  Fransico Him, MD   Chief Complaint  Patient presents with  . Hypertension    History of Present Illness:  Jordan Reeves is a 45 y.o. female who presents for follow up of HTN and palpitations. She does have a history of polycystic ovarian syndrome.  She does have premature family history of coronary artery disease with a father who died at 48 with an MI and uncle it died of 73 of an MI. Maternal grandmother also had coronary disease.  Her heart monitor showed no acute abnormalities except for mild sinus tachycardia and rare PVC. Due to complaints of chest pain with med adjustments, she was set up for ETT but cancelled it. She presents back today doing well. She says that occasionally  if she takes her BP med late her BP will immediately increase.  HR ranges from 50-100bpm.  Her BP normally 120/18mmHg.  She says that since starting exercising her HR has started to even out and stays in the 80's now. She occasionally feels pressure on her chest but nuclear stress test showed no ischemia.  She has no SOB or DOE.  She intermittently will have some LE edema.   She denies any palptiations.  Past Medical History  Diagnosis Date  . Hypertension   . Herpes simplex without mention of complication   . Diabetes mellitus without complication ([place])   . PCOS (polycystic ovarian syndrome)   . Infertility, female   . Thyroiditis     Hashimoto's now with hypothyroidism  . PVCs (premature ventricular contractions) 01/25/2015  . Obesity (BMI 30-39.9) 02/05/2016    Past Surgical History  Procedure Laterality Date  . Tonsillectomy and adenoidectomy    . Wisdom tooth extraction      Current Medications: Outpatient Prescriptions Prior to Visit  Medication Sig Dispense Refill  . labetalol (NORMODYNE) 200 MG tablet Take 400 mg by mouth 3 (three) times daily.    Marland Kitchen  LEVOXYL 175 MCG tablet Take 175 mcg by mouth daily.  3  . progesterone (PROMETRIUM) 200 MG capsule Take 200 mg by mouth daily. Pt takes 1 capsule by mouth starting on day 13-26.    Marland Kitchen Prenatal Vit-Fe Fumarate-FA (PRENATAL MULTIVITAMIN) TABS tablet Take 1 tablet by mouth daily at 12 noon.     No facility-administered medications prior to visit.     Allergies:   Review of patient's allergies indicates no known allergies.   Social History   Social History  . Marital Status: Married    Spouse Name: N/A  . Number of Children: N/A  . Years of Education: N/A   Social History Main Topics  . Smoking status: Never Smoker   . Smokeless tobacco: Never Used  . Alcohol Use: No  . Drug Use: No  . Sexual Activity: Yes    Birth Control/ Protection: None   Other Topics Concern  . None   Social History Narrative     Family History:  The patient's family history includes Breast cancer in her maternal aunt; Cervical cancer in her maternal aunt; Diabetes in her father and mother; Heart disease in her father, maternal grandfather, maternal grandmother, mother, and paternal grandfather; Hyperlipidemia in her mother; Hypertension in her mother; Lymphoma in her paternal grandmother.   ROS:   Please see the history of present illness.    ROS All other systems reviewed and  are negative.   PHYSICAL EXAM:   VS:  BP 134/82 mmHg  Pulse 78  Ht 5\' 8"  (1.727 m)  Wt 262 lb (118.842 kg)  BMI 39.85 kg/m2   GEN: Well nourished, well developed, in no acute distress HEENT: normal Neck: no JVD, carotid bruits, or masses Cardiac: RRR; no murmurs, rubs, or gallops,no edema.  Intact distal pulses bilaterally.  Respiratory:  clear to auscultation bilaterally, normal work of breathing GI: soft, nontender, nondistended, + BS MS: no deformity or atrophy Skin: warm and dry, no rash Neuro:  Alert and Oriented x 3, Strength and sensation are intact Psych: euthymic mood, full affect  Wt Readings from Last 3  Encounters:  02/05/16 262 lb (118.842 kg)  02/27/15 262 lb (118.842 kg)  01/25/15 262 lb 12.8 oz (119.205 kg)      Studies/Labs Reviewed:   EKG:  EKG is  ordered today.  The ekg ordered today demonstrates NSR at 78bpm with no ST changes  Recent Labs: No results found for requested labs within last 365 days.   Lipid Panel No results found for: CHOL, TRIG, HDL, CHOLHDL, VLDL, LDLCALC, LDLDIRECT  Additional studies/ records that were reviewed today include:  none    ASSESSMENT:    1. Benign essential HTN   2. PVCs (premature ventricular contractions)   3. Obesity (BMI 30-39.9)      PLAN:  In order of problems listed above:  1. HTN - BP seems to be well controlled. Continue Labetolol.  I did question her on symptoms of excessive daytime sleepiness because OSA can increase BP but she denies any issues with this and does not snore. 2. PVC's - these are suppressed on Labetolol. 3. Obesity - I have encouraged her to continue in her routine exercise program and cut back on carbs and portions.     Medication Adjustments/Labs and Tests Ordered: Current medicines are reviewed at length with the patient today.  Concerns regarding medicines are outlined above.  Medication changes, Labs and Tests ordered today are listed in the Patient Instructions below.  Patient Instructions  Medication Instructions:  Your physician recommends that you continue on your current medications as directed. Please refer to the Current Medication list given to you today.   Labwork: None  Testing/Procedures: None  Follow-Up: Your physician wants you to follow-up in: 1 year with Dr. Radford Pax. You will receive a reminder letter in the mail two months in advance. If you don't receive a letter, please call our office to schedule the follow-up appointment.   Any Other Special Instructions Will Be Listed Below (If Applicable).     If you need a refill on your cardiac medications before your next  appointment, please call your pharmacy.       Signed, Fransico Him, MD  02/06/2016 9:33 PM    [facility], [place], [place]  [address] Phone: [phone]; Fax: [phone]

## 2016-10-06 ENCOUNTER — Encounter: Payer: Self-pay | Admitting: Physician Assistant

## 2016-10-06 ENCOUNTER — Ambulatory Visit (INDEPENDENT_AMBULATORY_CARE_PROVIDER_SITE_OTHER): Payer: BLUE CROSS/BLUE SHIELD | Admitting: Physician Assistant

## 2016-10-06 VITALS — BP 172/102 | HR 84 | Ht 68.0 in | Wt 254.8 lb

## 2016-10-06 DIAGNOSIS — I10 Essential (primary) hypertension: Secondary | ICD-10-CM | POA: Diagnosis not present

## 2016-10-06 DIAGNOSIS — E669 Obesity, unspecified: Secondary | ICD-10-CM | POA: Diagnosis not present

## 2016-10-06 DIAGNOSIS — R002 Palpitations: Secondary | ICD-10-CM | POA: Diagnosis not present

## 2016-10-06 DIAGNOSIS — R079 Chest pain, unspecified: Secondary | ICD-10-CM

## 2016-10-06 MED ORDER — LABETALOL HCL 200 MG PO TABS: 400.0000 mg | ORAL_TABLET | Freq: Three times a day (TID) | ORAL | 3 refills | Status: DC

## 2016-10-06 NOTE — Patient Instructions (Signed)
Medication Instructions:  Your physician has recommended you make the following change in your medication:  1. Increase labetalol one and one half tablets (300 mg ) three times a day.    Labwork: Your physician recommends that you have lab work in: HCG/cmet/cbc/tsh   Testing/Procedures: Your physician has requested that you have a lexiscan myoview. For further information please visit [email or web address]. Please follow instruction sheet, as given.     Follow-Up: Your physician recommends that you keep your scheduled  follow-up appointment with Dr. Radford Pax.   Any Other Special Instructions Will Be Listed Below (If Applicable).     If you need a refill on your cardiac medications before your next appointment, please call your pharmacy.

## 2016-10-06 NOTE — Progress Notes (Signed)
Cardiology Office Note    Date:  10/06/2016   ID:  Jordan Reeves, DOB 18-Jan-1971, MRN [id number]  PCP:  Rory Percy, MD  Cardiologist: Dr. Radford Pax  Chief Complaint  Patient presents with  . Hypertension    History of Present Illness:  Jordan Reeves is a 46 y.o. female with history of hypertension and palpitations. She does have a premature family history of CAD with a father who died at 65 of an MI and an uncle died at 19 of an MI. Paternal grandmother had CAD.  Heart monitor in the past showed no abnormalities except for mild sinus tachycardia and rare PVCs. Normal nuclear stress test 7/6S 16 LVEF 67%.. She last saw Dr. Radford Pax 02/05/16. PVCs were suppressed with labetalol.  Patient comes in today complaining of blood pressures going up and down. It's as high as 172/102 Flows 90/60. She has been adjusting her labetalol. She is on low-sodium diet as her husband has a cardiomyopathy. She is wondering if she is pregnant because this is what happened with her prior pregnancies. She also hasn't had her thyroid checked in a while. She had an episode of tightness in her neck and back this morning when her blood pressure was high but it went away with 2 aspirin.  Past Medical History:  Diagnosis Date  . Benign essential HTN 01/25/2015  . Diabetes mellitus without complication ([place])   . Herpes simplex without mention of complication   . Hypertension   . Infertility, female   . Obesity (BMI 30-39.9) 02/05/2016  . PCOS (polycystic ovarian syndrome)   . PVCs (premature ventricular contractions) 01/25/2015  . Thyroiditis    Hashimoto's now with hypothyroidism    Past Surgical History:  Procedure Laterality Date  . TONSILLECTOMY AND ADENOIDECTOMY    . WISDOM TOOTH EXTRACTION      Current Medications: Outpatient Medications Prior to Visit  Medication Sig Dispense Refill  . LEVOXYL 175 MCG tablet Take 175 mcg by mouth daily.  3  . metFORMIN (GLUCOPHAGE-XR) 500 MG 24 hr tablet Take 500 mg by  mouth 2 (two) times daily.   5  . progesterone (PROMETRIUM) 200 MG capsule Take 200 mg by mouth daily. Pt takes 1 capsule by mouth starting on day 13-26.    Marland Kitchen labetalol (NORMODYNE) 200 MG tablet Take 400 mg by mouth 3 (three) times daily.     No facility-administered medications prior to visit.      Allergies:   Patient has no known allergies.   Social History   Social History  . Marital status: Married    Spouse name: N/A  . Number of children: N/A  . Years of education: N/A   Social History Main Topics  . Smoking status: Never Smoker  . Smokeless tobacco: Never Used  . Alcohol use No  . Drug use: No  . Sexual activity: Yes    Birth control/ protection: None   Other Topics Concern  . None   Social History Narrative  . None     Family History:  The patient's   family history includes Breast cancer in her maternal aunt; Cervical cancer in her maternal aunt; Diabetes in her father and mother; Heart disease in her father, maternal grandfather, maternal grandmother, mother, and paternal grandfather; Hyperlipidemia in her mother; Hypertension in her mother; Lymphoma in her paternal grandmother.   ROS:   Please see the history of present illness.    Review of Systems  Constitution: Positive for chills.  HENT: Negative.   Eyes:  Positive for visual disturbance.  Cardiovascular: Positive for chest pain.  Respiratory: Negative.   Hematologic/Lymphatic: Negative.   Musculoskeletal: Negative.  Negative for joint pain.  Gastrointestinal: Negative.   Genitourinary: Negative.   Neurological: Positive for dizziness.   All other systems reviewed and are negative.   PHYSICAL EXAM:   VS:  BP (!) 172/102   Pulse 84   Ht 5\' 8"  (1.727 m)   Wt 254 lb 12.8 oz (115.6 kg)   SpO2 98%   BMI 38.74 kg/m   Physical Exam  GEN: Obese, in no acute distress  Neck: no JVD, carotid bruits, or masses Cardiac:RRR; no murmurs, rubs, or gallops  Respiratory:  clear to auscultation bilaterally,  normal work of breathing GI: soft, nontender, nondistended, + BS Ext: without cyanosis, clubbing, or edema, Good distal pulses bilaterally Psych: euthymic mood, full affect  Wt Readings from Last 3 Encounters:  10/06/16 254 lb 12.8 oz (115.6 kg)  02/05/16 262 lb (118.8 kg)  02/27/15 262 lb (118.8 kg)      Studies/Labs Reviewed:   EKG:  EKG is  ordered today.  The ekg ordered today demonstrates Normal sinus rhythm, normal EKG  Recent Labs: No results found for requested labs within last 8760 hours.   Lipid Panel No results found for: CHOL, TRIG, HDL, CHOLHDL, VLDL, LDLCALC, LDLDIRECT  Additional studies/ records that were reviewed today include:   Lexi scan 2016 Study Highlights   Nuclear stress EF: 67%.  This is a low risk study.  The left ventricular ejection fraction is hyperdynamic (>65%).  No ischemia identified     2-D echo 2016 Study Conclusions   - Left ventricle: The cavity size was normal. There was mild   concentric hypertrophy. Systolic function was normal. The   estimated ejection fraction was in the range of 60% to 65%. Wall   motion was normal; there were no regional wall motion   abnormalities. Doppler parameters are consistent with abnormal   left ventricular relaxation (grade 1 diastolic dysfunction). The   E/e&' ratio is between 8-15, suggesting indeterminate LV filling   pressure. - Left atrium: The atrium was normal in size. - Tricuspid valve: There was trivial regurgitation. - Pulmonary arteries: PA peak pressure: 26 mm Hg (S). - Inferior vena cava: The vessel was normal in size. The   respirophasic diameter changes were in the normal range (>= 50%),   consistent with normal central venous pressure.   Impressions:   - LVEF 60-65%, mild LVH, diastolic dysfunction, indeterminate LV   filling pressure, trivial TR, normal RVSP.       ASSESSMENT:    1. Hypertension, unspecified type   2. Chest pain, unspecified type   3. Obesity (BMI  30-39.9)   4. Heart palpitations      PLAN:  In order of problems listed above:   Hypertension blood pressure has been fluctuating a lot lately. We'll check pregnancy test at her request, TSH, cemented in CBC. Increase labetalol 200 mg to 1-1/2 tablets 3 times a day. May need to increase it to 2 tablets twice a day and 1-1/2 tablets in the afternoon. Follow-up with Dr. Radford Pax in 2 weeks  Chest pain with family history of early CAD check Lexi scan. Most likely related to her hypertension  Obesity weight loss recommended. She has lost weight recently  Palpitations with PVCs have been controlled with labetalol no recent palpitations   Medication Adjustments/Labs and Tests Ordered: Current medicines are reviewed at length with the patient today.  Concerns  regarding medicines are outlined above.  Medication changes, Labs and Tests ordered today are listed in the Patient Instructions below. There are no Patient Instructions on file for this visit.   Sumner Boast, PA-C  10/06/2016 2:05 PM    [facility] [address], [place], [place]  [address] Phone: [phone]; Fax: [phone]

## 2016-10-07 LAB — HCG, SERUM, QUALITATIVE: HCG, BETA SUBUNIT, QUAL, SERUM: NEGATIVE m[IU]/mL (ref <6)

## 2016-10-07 LAB — COMPREHENSIVE METABOLIC PANEL
A/G RATIO: 1.6 (ref 1.2–2.2)
ALK PHOS: 78 IU/L (ref 39–117)
ALT: 20 IU/L (ref 0–32)
AST: 15 IU/L (ref 0–40)
Albumin: 4.6 g/dL (ref 3.5–5.5)
BILIRUBIN TOTAL: 0.4 mg/dL (ref 0.0–1.2)
BUN/Creatinine Ratio: 13 (ref 9–23)
BUN: 9 mg/dL (ref 6–24)
CHLORIDE: 100 mmol/L (ref 96–106)
CO2: 24 mmol/L (ref 18–29)
Calcium: 9.6 mg/dL (ref 8.7–10.2)
Creatinine, Ser: 0.71 mg/dL (ref 0.57–1.00)
GFR calc Af Amer: 120 mL/min/{1.73_m2} (ref >59)
GFR calc non Af Amer: 104 mL/min/{1.73_m2} (ref >59)
GLUCOSE: 128 mg/dL — AB (ref 65–99)
Globulin, Total: 2.9 g/dL (ref 1.5–4.5)
POTASSIUM: 4.7 mmol/L (ref 3.5–5.2)
Sodium: 140 mmol/L (ref 134–144)
Total Protein: 7.5 g/dL (ref 6.0–8.5)

## 2016-10-07 LAB — CBC WITH DIFFERENTIAL/PLATELET
BASOS ABS: 0.1 10*3/uL (ref 0.0–0.2)
Basos: 0 %
EOS (ABSOLUTE): 0.1 10*3/uL (ref 0.0–0.4)
Eos: 1 %
Hematocrit: 40.7 % (ref 34.0–46.6)
Hemoglobin: 14 g/dL (ref 11.1–15.9)
Immature Grans (Abs): 0 10*3/uL (ref 0.0–0.1)
Immature Granulocytes: 0 %
LYMPHS ABS: 2.1 10*3/uL (ref 0.7–3.1)
LYMPHS: 17 %
MCH: 29.4 pg (ref 26.6–33.0)
MCHC: 34.4 g/dL (ref 31.5–35.7)
MCV: 85 fL (ref 79–97)
Monocytes Absolute: 0.8 10*3/uL (ref 0.1–0.9)
Monocytes: 6 %
Neutrophils Absolute: 9.4 10*3/uL — ABNORMAL HIGH (ref 1.4–7.0)
Neutrophils: 76 %
PLATELETS: 350 10*3/uL (ref 150–379)
RBC: 4.77 x10E6/uL (ref 3.77–5.28)
RDW: 14.2 % (ref 12.3–15.4)
WBC: 12.5 10*3/uL — AB (ref 3.4–10.8)

## 2016-10-07 LAB — TSH: TSH: 0.39 u[IU]/mL — AB (ref 0.450–4.500)

## 2016-10-08 ENCOUNTER — Telehealth: Payer: Self-pay | Admitting: Cardiology

## 2016-10-08 NOTE — Telephone Encounter (Signed)
Returned pts call and we went over her lab results. She verbalized understanding and appreciation for the call.

## 2016-10-08 NOTE — Telephone Encounter (Signed)
Follow Up    Pt returning phone call about labs

## 2016-10-09 ENCOUNTER — Telehealth: Payer: Self-pay | Admitting: Cardiology

## 2016-10-09 NOTE — Telephone Encounter (Signed)
New Message    Per husband they needs you to fax her lab results to Dr Chalmers Cater fax [phone]   Per husband they signed release of information for results to be sent when test was done.

## 2016-10-09 NOTE — Telephone Encounter (Signed)
Labs faxed to Dr. Almetta Lovely off @ [facility] [phone].

## 2016-10-09 NOTE — Telephone Encounter (Signed)
Mr. Zellman is asking that we fax patient Copywriter, advertising) lab work results to the [facility] for Dr. Debbora Presto. Fax number [phone]. Thanks.

## 2016-10-16 ENCOUNTER — Telehealth (HOSPITAL_COMMUNITY): Payer: Self-pay | Admitting: *Deleted

## 2016-10-16 NOTE — Telephone Encounter (Signed)
Called patient to remind patient of upcoming nuclear test, patient wants to reschedule.  Kirstie Peri

## 2016-10-21 ENCOUNTER — Ambulatory Visit (HOSPITAL_COMMUNITY): Payer: BLUE CROSS/BLUE SHIELD

## 2016-10-22 ENCOUNTER — Ambulatory Visit: Payer: BLUE CROSS/BLUE SHIELD | Admitting: Cardiology

## 2016-10-22 ENCOUNTER — Ambulatory Visit (HOSPITAL_COMMUNITY): Payer: BLUE CROSS/BLUE SHIELD

## 2016-10-28 ENCOUNTER — Encounter: Payer: Self-pay | Admitting: Cardiology

## 2016-11-04 ENCOUNTER — Encounter: Payer: Self-pay | Admitting: Cardiology

## 2016-11-04 ENCOUNTER — Ambulatory Visit (INDEPENDENT_AMBULATORY_CARE_PROVIDER_SITE_OTHER): Payer: BLUE CROSS/BLUE SHIELD | Admitting: Cardiology

## 2016-11-04 VITALS — BP 154/102 | HR 89 | Ht 68.0 in | Wt 248.8 lb

## 2016-11-04 DIAGNOSIS — R079 Chest pain, unspecified: Secondary | ICD-10-CM | POA: Diagnosis not present

## 2016-11-04 DIAGNOSIS — I493 Ventricular premature depolarization: Secondary | ICD-10-CM

## 2016-11-04 DIAGNOSIS — I10 Essential (primary) hypertension: Secondary | ICD-10-CM

## 2016-11-04 NOTE — Patient Instructions (Addendum)
Medication Instructions:  Your physician recommends that you continue on your current medications as directed. Please refer to the Current Medication list given to you today.   Labwork: None  Testing/Procedures: None  Follow-Up: Please check your BLOOD PRESSURE daily and bring results to your HTN Clinic appointment. You have been scheduled 11/13/16 at 11:00 for your HTN Clinic appointment.  Your physician wants you to follow-up in: 6 months with Dr. Theodosia Blender assistant. You will receive a reminder letter in the mail two months in advance. If you don't receive a letter, please call our office to schedule the follow-up appointment.   Your physician wants you to follow-up in: 1 year with Dr. Radford Pax. You will receive a reminder letter in the mail two months in advance. If you don't receive a letter, please call our office to schedule the follow-up appointment.   Any Other Special Instructions Will Be Listed Below (If Applicable).     If you need a refill on your cardiac medications before your next appointment, please call your pharmacy.

## 2016-11-04 NOTE — Progress Notes (Signed)
Cardiology Office Note    Date:  11/04/2016   ID:  Jordan Reeves, DOB Nov 09, 1970, MRN [id number]  PCP:  Rory Percy, MD  Cardiologist:  Fransico Him, MD   Chief Complaint  Patient presents with  . Follow-up    pvc's, HTN    History of Present Illness:  Jordan Reeves is a 46 y.o. female who presents for follow up of HTN and PVCs.She has a premature family history of coronary artery disease with a father who died at 57 with an MI and uncle it died of 89 of an MI. Maternal grandmother also had coronary disease.Nuclear stress test showed no ischemia.  She saw my PA recently for palpitations and poor BP controlled and a TSH was suppressed at 0.390.  She saw her Endocrinologist and TSH had normalized and it was thought that she had thyroiditis.  She was taken off her thyroid med for a few days and started on steroids.  She lost 4lbs in 2 days and the steroids were tapered and her thyroid medication was adjusted and restarted.  She started having bad palpitations and burning in her hands and feet and her thyroid med was actually stopped and she is feeling the best she has felt in the years.  Her palpitations have significantly improved but her BP has been trending upward intermittently.  She presents back today doing well. She is doing well. She has no SOB or DOE.  She denies any chest pain or pressure.  Her LE edema has resolved  She denies any dizziness (except when BP is low) or syncope.     Past Medical History:  Diagnosis Date  . Benign essential HTN 01/25/2015  . Diabetes mellitus without complication ([place])   . Herpes simplex without mention of complication   . Hypertension   . Infertility, female   . Obesity (BMI 30-39.9) 02/05/2016  . PCOS (polycystic ovarian syndrome)   . PVCs (premature ventricular contractions) 01/25/2015  . Thyroiditis    Hashimoto's now with hypothyroidism    Past Surgical History:  Procedure Laterality Date  . TONSILLECTOMY AND ADENOIDECTOMY    . WISDOM TOOTH  EXTRACTION      Current Medications: Current Meds  Medication Sig  . labetalol (NORMODYNE) 200 MG tablet Take 2 tablets (400 mg total) by mouth 3 (three) times daily.  . progesterone (PROMETRIUM) 200 MG capsule Take 200 mg by mouth daily. Pt takes 1 capsule by mouth starting on day 13-26.  . silver sulfADIAZINE (SILVADENE) 1 % cream APPLY TWICE DAILY TO BOTH WOUNDS ON LEFT AND RIGHT FOOT AS DIRECTED    Allergies:   Patient has no known allergies.   Social History   Social History  . Marital status: Married    Spouse name: N/A  . Number of children: N/A  . Years of education: N/A   Social History Main Topics  . Smoking status: Never Smoker  . Smokeless tobacco: Never Used  . Alcohol use No  . Drug use: No  . Sexual activity: Yes    Birth control/ protection: None   Other Topics Concern  . None   Social History Narrative  . None     Family History:  The patient's family history includes Breast cancer in her maternal aunt; Cervical cancer in her maternal aunt; Diabetes in her father and mother; Heart disease in her father, maternal grandfather, maternal grandmother, mother, and paternal grandfather; Hyperlipidemia in her mother; Hypertension in her mother; Lymphoma in her paternal grandmother.   ROS:  Please see the history of present illness.    ROS All other systems reviewed and are negative.  No flowsheet data found.     PHYSICAL EXAM:   VS:  BP (!) 154/102   Pulse 89   Ht 5\' 8"  (1.727 m)   Wt 248 lb 12.8 oz (112.9 kg)   SpO2 99%   BMI 37.83 kg/m    GEN: Well nourished, well developed, in no acute distress  HEENT: normal  Neck: no JVD, carotid bruits, or masses Cardiac: RRR; no murmurs, rubs, or gallops,no edema.  Intact distal pulses bilaterally.  Respiratory:  clear to auscultation bilaterally, normal work of breathing GI: soft, nontender, nondistended, + BS MS: no deformity or atrophy  Skin: warm and dry, no rash Neuro:  Alert and Oriented x 3,  Strength and sensation are intact Psych: euthymic mood, full affect  Wt Readings from Last 3 Encounters:  11/04/16 248 lb 12.8 oz (112.9 kg)  10/06/16 254 lb 12.8 oz (115.6 kg)  02/05/16 262 lb (118.8 kg)      Studies/Labs Reviewed:   EKG:  EKG is not ordered today.  Recent Labs: 10/06/2016: ALT 20; BUN 9; Creatinine, Ser 0.71; Platelets 350; Potassium 4.7; Sodium 140; TSH 0.390   Lipid Panel No results found for: CHOL, TRIG, HDL, CHOLHDL, VLDL, LDLCALC, LDLDIRECT  Additional studies/ records that were reviewed today include:  none    ASSESSMENT:    1. Benign essential HTN   2. Chest pain, unspecified type   3. PVCs (premature ventricular contractions)      PLAN:  In order of problems listed above:  1.  Hypertension blood pressure has been somewhat erratic and at home says that her BP has run low some but at other times is high.  She will continue on her Labetolol at current dose.  I have asked her to check her BP daily for a week and followup in HTN clinic in 1 week.   2.  Chest pain with family history of early CAD - Nuclear stress test was normal. Most likely related to her hypertension.  This seems to have resolved after getting her thyroid adjusted.  3.  Palpitations with PVCs have been controlled with labetalol no recent palpitations- this has significantly improved after getting off of her thyroid meds.  She will continue on Labetolol.    Medication Adjustments/Labs and Tests Ordered: Current medicines are reviewed at length with the patient today.  Concerns regarding medicines are outlined above.  Medication changes, Labs and Tests ordered today are listed in the Patient Instructions below.  There are no Patient Instructions on file for this visit.   Signed, Fransico Him, MD  11/04/2016 11:05 AM    [facility] [address], [place], [place]  [address] Phone: [phone]; Fax: [phone]

## 2016-11-13 ENCOUNTER — Ambulatory Visit (INDEPENDENT_AMBULATORY_CARE_PROVIDER_SITE_OTHER): Payer: BLUE CROSS/BLUE SHIELD | Admitting: Pharmacist

## 2016-11-13 VITALS — BP 142/100 | HR 75

## 2016-11-13 DIAGNOSIS — I10 Essential (primary) hypertension: Secondary | ICD-10-CM

## 2016-11-13 DIAGNOSIS — R002 Palpitations: Secondary | ICD-10-CM

## 2016-11-13 NOTE — Patient Instructions (Signed)
Return for a a follow up appointment in 3-4 weeks  Your blood pressure today is 140/100  Take your BP meds as follows: Change labetolol to 200 mg every 12 hours. Continue to check your blood pressure daily for [place] to see the effects.   Bring all of your meds, your BP cuff and your record of home blood pressures to your next appointment.  Exercise as you're able, try to walk approximately 30 minutes per day.  Keep salt intake to a minimum, especially watch canned and prepared boxed foods.  Eat more fresh fruits and vegetables and fewer canned items.  Avoid eating in fast food restaurants.    HOW TO TAKE YOUR BLOOD PRESSURE: . Rest 5 minutes before taking your blood pressure. .  Don't smoke or drink caffeinated beverages for at least 30 minutes before. . Take your blood pressure before (not after) you eat. . Sit comfortably with your back supported and both feet on the floor (don't cross your legs). . Elevate your arm to heart level on a table or a desk. . Use the proper sized cuff. It should fit smoothly and snugly around your bare upper arm. There should be enough room to slip a fingertip under the cuff. The bottom edge of the cuff should be 1 inch above the crease of the elbow. . Ideally, take 3 measurements at one sitting and record the average.

## 2016-11-13 NOTE — Progress Notes (Signed)
Patient ID: Jordan Reeves                 DOB: [birth year]                      MRN: [id number]     HPI: Jordan Reeves is a 46 y.o. female patient of Dr. Radford Pax with [place] below who presents today for hypertension evaluation. She has a significant family history of premature CAD, and a history of PVCs and palpitations. With adjusting of thyroid meds, palpitations were resolved, but HTN still continues to worsen.   At her appointment last week with Dr. Radford Pax, no medication changes were made. She was asked to check BP daily and bring log to HTN clinic.   Patient presents today with husband. She has self decreased the labetolol to 200 mg in the morning, 200 mg in the afternoon, and 100 mg in the evening. She notes that she has some headaches, dizziness, and sometimes chills when her pressures are high or low, which happens more often in the evening. She has recently stopped her thyroid medications and notes that this is the best she has felt in years. She is having less palpitations and overall heart "quivering".   She notes that "they will not start" oral contraceptive medications on her due to her HTN. 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 The labetolol was originally started when she became pregnant without planning to, but she miscarried. She mentions that she would like to give it 6 months to 1 year before she permanently decides to avoid having children.  Cardiac Hx: HTN, PVCs  Current HTN meds: Labetolol 200 mg AM, 200 mg lunch, 100 mg at bedtime   Previously tried: Metoprolol (says she did much better on this, but labetolol was started due to pregnancy)  BP goal: <130/80  Family History: Father - passed from MI (6)  Home BP readings: Morning 100-130/60-80;  Afternoon: 110-120 70s-80s; 120-140s/70-80s  Wt Readings from Last 3 Encounters:  11/04/16 248 lb 12.8 oz (112.9 kg)  10/06/16 254 lb 12.8 oz (115.6 kg)  02/05/16 262 lb (118.8 kg)   BP Readings from Last 3 Encounters:  11/13/16  (!) 142/100  11/04/16 (!) 154/102  10/06/16 (!) 172/102   Pulse Readings from Last 3 Encounters:  11/13/16 75  11/04/16 89  10/06/16 84    Renal function: CrCl cannot be calculated (Patient's most recent lab result is older than the maximum 21 days allowed.).  Past Medical History:  Diagnosis Date  . Benign essential HTN 01/25/2015  . Diabetes mellitus without complication ([place])   . Herpes simplex without mention of complication   . Hypertension   . Infertility, female   . Obesity (BMI 30-39.9) 02/05/2016  . PCOS (polycystic ovarian syndrome)   . PVCs (premature ventricular contractions) 01/25/2015  . Thyroiditis    Hashimoto's now with hypothyroidism    Current Outpatient Prescriptions on File Prior to Visit  Medication Sig Dispense Refill  . progesterone (PROMETRIUM) 200 MG capsule Take 200 mg by mouth daily. Pt takes 1 capsule by mouth starting on day 13-26.    . silver sulfADIAZINE (SILVADENE) 1 % cream APPLY TWICE DAILY TO BOTH WOUNDS ON LEFT AND RIGHT FOOT AS DIRECTED  2   No current facility-administered medications on file prior to visit.     No Known Allergies  Blood pressure (!) 142/100, pulse 75, SpO2 98 %.   Assessment/Plan: Hypertension: Patient's blood pressure is elevated in the office, but  appears to be mostly controlled at home.  1. Change labetolol to 200 mg every 12 hours as patient convinced this will control pressures better. We discussed the addition of an ACE/ARB for HTN with concurrent diabetes, but decided to defer that addition until the patient is certain she does not want to become pregnant. We counseled that ACE/ARBs are teratogenic and that we would want to avoid exposing a fetus to these agents. She would likely also benefit from change in betablocker as well (to carvedilol), but again labetalol is found to be safe during pregnancy. If pressure high at next visit will plan to add nifedipine since this can be used during pregnancy as well.  Patient  was asked to continue to monitor blood pressures.  Follow up in 4-5 weeks to assess BP control and antihypertensive regimen.    Patient was seen with Courtney Heys, PharmD Candidate 2018   Thank you, Lelan Pons. Patterson Hammersmith, [facility]  11/13/2016 12:03 PM

## 2016-12-09 ENCOUNTER — Ambulatory Visit (HOSPITAL_COMMUNITY): Payer: BLUE CROSS/BLUE SHIELD

## 2016-12-10 ENCOUNTER — Ambulatory Visit (HOSPITAL_COMMUNITY): Payer: BLUE CROSS/BLUE SHIELD

## 2016-12-11 ENCOUNTER — Ambulatory Visit (HOSPITAL_COMMUNITY): Payer: BLUE CROSS/BLUE SHIELD

## 2016-12-18 ENCOUNTER — Ambulatory Visit: Payer: BLUE CROSS/BLUE SHIELD

## 2017-02-10 ENCOUNTER — Ambulatory Visit: Payer: BLUE CROSS/BLUE SHIELD | Admitting: Cardiology

## 2017-02-17 ENCOUNTER — Ambulatory Visit (HOSPITAL_COMMUNITY): Payer: BLUE CROSS/BLUE SHIELD

## 2017-02-18 ENCOUNTER — Ambulatory Visit (HOSPITAL_COMMUNITY): Payer: BLUE CROSS/BLUE SHIELD

## 2017-03-27 LAB — OB RESULTS CONSOLE HIV ANTIBODY (ROUTINE TESTING): HIV: NONREACTIVE

## 2017-03-27 LAB — OB RESULTS CONSOLE HEPATITIS B SURFACE ANTIGEN: Hepatitis B Surface Ag: NEGATIVE

## 2017-03-27 LAB — OB RESULTS CONSOLE RPR: RPR: NONREACTIVE

## 2017-03-27 LAB — OB RESULTS CONSOLE ABO/RH: RH TYPE: POSITIVE

## 2017-03-27 LAB — OB RESULTS CONSOLE ANTIBODY SCREEN: ANTIBODY SCREEN: NEGATIVE

## 2017-03-27 LAB — OB RESULTS CONSOLE GC/CHLAMYDIA
Chlamydia: NEGATIVE
Gonorrhea: NEGATIVE

## 2017-03-27 LAB — OB RESULTS CONSOLE RUBELLA ANTIBODY, IGM: Rubella: IMMUNE

## 2017-04-23 ENCOUNTER — Telehealth: Payer: Self-pay | Admitting: Cardiology

## 2017-04-23 NOTE — Telephone Encounter (Signed)
New Message     Pt obgyn wants to ween her off of  labetalol (NORMODYNE) 200 MG tablet Take 1 tablet (200 mg total) by mouth 2 (two) times daily.   And switch it to nifedipine , please call and advise what you would like done    Pt has appt 04/28/17 1130a dunn

## 2017-04-28 ENCOUNTER — Ambulatory Visit: Payer: BLUE CROSS/BLUE SHIELD | Admitting: Physician Assistant

## 2017-04-28 NOTE — Progress Notes (Signed)
Cardiology Office Note    Date:  04/29/2017  ID:  Jordan Reeves, DOB Dec 06, 1970, MRN [id number] PCP:  Jordan Percy, MD  Cardiologist: Dr. Radford Reeves   Chief Complaint: discuss beta blocker  History of Present Illness:  Jordan Reeves is a 46 y.o. 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 She established care with cardiology in 2016 due to marked HTN and palpitations. Event monitor 11/2014 showed NSR, occasional sinus tach (correlating with symptoms), rare PVCs, no significant arrhythmias. 2D echo 01/2015 showed EF 60-65%, grade 1 DD, mild LVH. Nuclear stress test 02/2015 done for chest pain was normal. Around that time she was found to have suppressed TSH. She had been on thyroid med for 20 years. She saw her endocrinologist and TSH had normalized and it was thought that she had thyroiditis. She was taken off her thyroid med and started on steroids. She lost 4lbs in 2 days and the steroids were tapered and her thyroid medication was restarted and adjusted. She began having bad palpitations and burning in her hands and feet so her thyroid medication was then stopped. This was eventually restarted and has been followed by a different endocrinologist. She has since been on labetalol for her blood pressure.  She recently found out she was pregnant again for the 5th time. She is now [redacted] weeks pregnant and so far she's gotten a good report about the health of the pregnancy. (Longest pregnancy in the past was 9 weeks. She denies any prior h/o autoimmune workup.) Due to her history she said she has been referred to a high risk team but has made that appointment yet. She sees her endocrinologist tomorrow for close following of her thyroid function. She reports her PCP referred her here to help simplify her medication regimen to include weaning off beta blocker and going solely onto  nifedipine. She currently takes labetalol 200mg  2 tabs QAM, 2 tabs afternoon, and 1 tablet at night. She states she started doing the 1 tablet at night at the request of gyn in attempts to transition to solo nifedipine. She's been on nifedipine 60mg  for about a month now. She reports in general her blood pressure has been running 287O-676H systolic but occasionally has outliers like today when BP was 146/72 with recheck by me 144/82. She ate out lunch prior to visit but in general tries to avoid salt. She also has had rare breakthrough palpitations. She reports these actually improve quickly after using vaginal progesterone. No CP, SOB, dizziness, syncope.  Past Medical History:  Diagnosis Date  . Benign essential HTN 01/25/2015  . Diabetes mellitus without complication ([place])   . Herpes simplex without mention of complication   . Hypertension   . Infertility, female   . Obesity (BMI 30-39.9) 02/05/2016  . PCOS (polycystic ovarian syndrome)   . PVCs (premature ventricular contractions) 01/25/2015  . Thyroiditis    Hashimoto's now with hypothyroidism    Past Surgical History:  Procedure Laterality Date  . TONSILLECTOMY AND ADENOIDECTOMY    . WISDOM TOOTH EXTRACTION      Current Medications: Current Meds  Medication Sig  . Insulin Zinc Human (NOVOLIN L ) Take 20 Units by mouth daily.  Marland Kitchen labetalol (NORMODYNE) 200 MG tablet Take 1 tablet (200 mg total) by mouth  - taking 2 tabs QAM, 2 tabs afternoon, 1 tablet QPM  . levothyroxine (SYNTHROID) 100 MCG tablet Take 100 mg  by mouth daily.  Marland Kitchen NIFEdipine (PROCARDIA XL/ADALAT-CC) 60 MG 24 hr tablet Take 60 mg by mouth daily.  . progesterone (PROMETRIUM) 200 MG capsule Take 200 mg by mouth daily. Pt takes 1 capsule by mouth starting on day 13-26.  . silver sulfADIAZINE (SILVADENE) 1 % cream APPLY TWICE DAILY TO BOTH WOUNDS ON LEFT AND RIGHT FOOT AS DIRECTED     Allergies:   Patient has no known allergies.   Social History   Social History  .  Marital status: Married    Spouse name: N/A  . Number of children: N/A  . Years of education: N/A   Social History Main Topics  . Smoking status: Never Smoker  . Smokeless tobacco: Never Used  . Alcohol use No  . Drug use: No  . Sexual activity: Yes    Birth control/ protection: None   Other Topics Concern  . None   Social History Narrative  . None     Family History:  Family History  Problem Relation Age of Onset  . Heart disease Paternal Grandfather   . Lymphoma Paternal Grandmother   . Heart disease Maternal Grandmother   . Heart disease Maternal Grandfather   . Heart disease Father   . Diabetes Father   . Diabetes Mother   . Hypertension Mother   . Heart disease Mother        heart murmu  . Hyperlipidemia Mother   . Breast cancer Maternal Aunt   . Cervical cancer Maternal Aunt     ROS:   Please see the history of present illness. All other systems are reviewed and otherwise negative.    PHYSICAL EXAM:   VS:  BP (!) 146/74   Pulse 86   Ht 5\' 8"  (1.727 m)   Wt 226 lb (102.5 kg)   LMP  (LMP Unknown)   BMI 34.36 kg/m   BMI: Body mass index is 34.36 kg/m. GEN: Well nourished, well developed obese WF, in no acute distress  HEENT: normocephalic, atraumatic Neck: no JVD, carotid bruits, or masses Cardiac: RRR; no murmurs, rubs, or gallops, no edema  Respiratory:  clear to auscultation bilaterally, normal work of breathing GI: soft, nontender, nondistended, + BS MS: no deformity or atrophy  Skin: warm and dry, no rash Neuro:  Alert and Oriented x 3, Strength and sensation are intact, follows commands Psych: euthymic mood, full affect  Wt Readings from Last 3 Encounters:  04/29/17 226 lb (102.5 kg)  11/04/16 248 lb 12.8 oz (112.9 kg)  10/06/16 254 lb 12.8 oz (115.6 kg)      Studies/Labs Reviewed:   EKG:  EKG was ordered today and personally reviewed by me and demonstrates NSR 84bpm, no ectopy, normal intervals.  Recent Labs: 10/06/2016: ALT 20; BUN  9; Creatinine, Ser 0.71; Hemoglobin 14.0; Platelets 350; Potassium 4.7; Sodium 140; TSH 0.390   Lipid Panel No results found for: CHOL, TRIG, HDL, CHOLHDL, VLDL, LDLCALC, LDLDIRECT  Additional studies/ records that were reviewed today include: Summarized above    ASSESSMENT & PLAN:   1. Essential HTN - reviewed with Dr. Acie Fredrickson (doctor of the day) today in clinic. Given her history of PVCs and significant HTN in the past, we feel that continuing beta blocker is indicated at this time. Given that her BP is 140s/70s today on both labetalol and nifedipine, it is unlike that she would achieve BP control on nifedipine alone and other BP medication options are limited given her pregnancy. Labetalol is category C but has been  felt by multiple members of this practice to be safe throughout pregnancy. Due to rare breakthrough palpitations and occasional spikes in BP, will increase dose back to prior maintenance dose of 400mg  (2 tabs) TID. We discussed risk of intrauterine growth restriction with beta blockers and she reports she was already told she would be getting regular growth scans throughout her pregnancy given her history. Dr. Acie Fredrickson and I agree that she should be followed in high risk pregnancy clinic with very close monitoring of her blood pressure and have encouraged her to call their office back today to schedule her first appointment. She has f/u with endocrinology tomorrow and f/u with regular OB next week. Advised her to call her OB if her BP continues to run >443 systolic. 2. PVCs - continue beta blocker. Advised patient to call if increased symptoms. 3. Recurrent miscarriage - per OB/high risk team. Advised her to discuss ongoing progesterone usage with OB. 4. Obesity - weight is actually down from previous values. She reports she's been working on healthier eating given that her husband follows with Dr. Aundra Dubin for CHF.  Disposition: F/u with Dr. Radford Reeves in 3 months.   Medication  Adjustments/Labs and Tests Ordered: Current medicines are reviewed at length with the patient today.  Concerns regarding medicines are outlined above. Medication changes, Labs and Tests ordered today are summarized above and listed in the Patient Instructions accessible in Encounters.   Signed, Charlie Pitter, PA-C  04/29/2017 11:47 AM    [address], [place], [place]  [id number] Phone: [phone]; Fax: [phone]

## 2017-04-29 ENCOUNTER — Ambulatory Visit (INDEPENDENT_AMBULATORY_CARE_PROVIDER_SITE_OTHER): Payer: BLUE CROSS/BLUE SHIELD | Admitting: Physician Assistant

## 2017-04-29 ENCOUNTER — Encounter: Payer: Self-pay | Admitting: Physician Assistant

## 2017-04-29 VITALS — BP 146/74 | HR 86 | Ht 68.0 in | Wt 226.0 lb

## 2017-04-29 DIAGNOSIS — N96 Recurrent pregnancy loss: Secondary | ICD-10-CM | POA: Diagnosis not present

## 2017-04-29 DIAGNOSIS — I10 Essential (primary) hypertension: Secondary | ICD-10-CM | POA: Diagnosis not present

## 2017-04-29 DIAGNOSIS — I493 Ventricular premature depolarization: Secondary | ICD-10-CM | POA: Diagnosis not present

## 2017-04-29 DIAGNOSIS — E669 Obesity, unspecified: Secondary | ICD-10-CM | POA: Diagnosis not present

## 2017-04-29 MED ORDER — LABETALOL HCL 200 MG PO TABS: 400.0000 mg | ORAL_TABLET | Freq: Three times a day (TID) | ORAL | Status: AC

## 2017-04-29 NOTE — Patient Instructions (Addendum)
Medication Instructions:  Your physician has recommended you make the following change in your medication:  1.  INCREASE the Labetolol to 200 mg tablets taking 2 tablets by mouth three times a day   Labwork: None ordered  Testing/Procedures: None ordered  Follow-Up: Your physician recommends that you schedule a follow-up appointment in: 3 MONTHS WITH DR. Radford Pax   Any Other Special Instructions Will Be Listed Below (If Applicable).     If you need a refill on your cardiac medications before your next appointment, please call your pharmacy.

## 2017-04-29 NOTE — Telephone Encounter (Signed)
See OV note from today.

## 2017-05-01 ENCOUNTER — Other Ambulatory Visit (HOSPITAL_COMMUNITY): Payer: Self-pay | Admitting: Obstetrics and Gynecology

## 2017-05-01 ENCOUNTER — Encounter (HOSPITAL_COMMUNITY): Payer: Self-pay

## 2017-05-01 ENCOUNTER — Inpatient Hospital Stay (HOSPITAL_COMMUNITY)
Admission: AD | Admit: 2017-05-01 | Discharge: 2017-05-01 | Disposition: A | Discharge status: Home / Self Care | Payer: BLUE CROSS/BLUE SHIELD | Source: Ambulatory Visit | Attending: Obstetrics and Gynecology | Admitting: Obstetrics and Gynecology

## 2017-05-01 DIAGNOSIS — O24112 Pre-existing diabetes mellitus, type 2, in pregnancy, second trimester: Secondary | ICD-10-CM | POA: Insufficient documentation

## 2017-05-01 DIAGNOSIS — E119 Type 2 diabetes mellitus without complications: Secondary | ICD-10-CM

## 2017-05-01 DIAGNOSIS — Z6830 Body mass index (BMI) 30.0-30.9, adult: Secondary | ICD-10-CM | POA: Diagnosis not present

## 2017-05-01 DIAGNOSIS — E669 Obesity, unspecified: Secondary | ICD-10-CM | POA: Diagnosis not present

## 2017-05-01 DIAGNOSIS — Z794 Long term (current) use of insulin: Secondary | ICD-10-CM | POA: Diagnosis not present

## 2017-05-01 DIAGNOSIS — O10012 Pre-existing essential hypertension complicating pregnancy, second trimester: Secondary | ICD-10-CM | POA: Insufficient documentation

## 2017-05-01 DIAGNOSIS — E039 Hypothyroidism, unspecified: Secondary | ICD-10-CM | POA: Diagnosis not present

## 2017-05-01 DIAGNOSIS — I10 Essential (primary) hypertension: Secondary | ICD-10-CM

## 2017-05-01 DIAGNOSIS — O09522 Supervision of elderly multigravida, second trimester: Secondary | ICD-10-CM

## 2017-05-01 DIAGNOSIS — Z3A15 15 weeks gestation of pregnancy: Secondary | ICD-10-CM | POA: Diagnosis not present

## 2017-05-01 DIAGNOSIS — R000 Tachycardia, unspecified: Secondary | ICD-10-CM | POA: Diagnosis present

## 2017-05-01 DIAGNOSIS — O99212 Obesity complicating pregnancy, second trimester: Secondary | ICD-10-CM | POA: Insufficient documentation

## 2017-05-01 DIAGNOSIS — O26892 Other specified pregnancy related conditions, second trimester: Secondary | ICD-10-CM | POA: Diagnosis not present

## 2017-05-01 DIAGNOSIS — I493 Ventricular premature depolarization: Secondary | ICD-10-CM | POA: Insufficient documentation

## 2017-05-01 DIAGNOSIS — N96 Recurrent pregnancy loss: Secondary | ICD-10-CM

## 2017-05-01 DIAGNOSIS — Z8759 Personal history of other complications of pregnancy, childbirth and the puerperium: Secondary | ICD-10-CM

## 2017-05-01 DIAGNOSIS — O09529 Supervision of elderly multigravida, unspecified trimester: Secondary | ICD-10-CM

## 2017-05-01 DIAGNOSIS — O99282 Endocrine, nutritional and metabolic diseases complicating pregnancy, second trimester: Secondary | ICD-10-CM | POA: Insufficient documentation

## 2017-05-01 LAB — URINALYSIS, ROUTINE W REFLEX MICROSCOPIC
Bilirubin Urine: NEGATIVE
Glucose, UA: NEGATIVE mg/dL
Hgb urine dipstick: NEGATIVE
Ketones, ur: NEGATIVE mg/dL
Leukocytes, UA: NEGATIVE
Nitrite: NEGATIVE
Protein, ur: NEGATIVE mg/dL
Specific Gravity, Urine: 1.005 (ref 1.005–1.030)
pH: 6 (ref 5.0–8.0)

## 2017-05-01 LAB — COMPREHENSIVE METABOLIC PANEL
ALBUMIN: 3.7 g/dL (ref 3.5–5.0)
ALT: 22 U/L (ref 14–54)
ANION GAP: 10 (ref 5–15)
AST: 17 U/L (ref 15–41)
Alkaline Phosphatase: 49 U/L (ref 38–126)
BUN: 9 mg/dL (ref 6–20)
CALCIUM: 9.1 mg/dL (ref 8.9–10.3)
CHLORIDE: 105 mmol/L (ref 101–111)
CO2: 21 mmol/L — AB (ref 22–32)
Creatinine, Ser: 0.62 mg/dL (ref 0.44–1.00)
GFR calc Af Amer: >60 mL/min (ref >60)
GFR calc non Af Amer: >60 mL/min (ref >60)
GLUCOSE: 135 mg/dL — AB (ref 65–99)
Potassium: 4.2 mmol/L (ref 3.5–5.1)
SODIUM: 136 mmol/L (ref 135–145)
Total Bilirubin: 0.5 mg/dL (ref 0.3–1.2)
Total Protein: 7.1 g/dL (ref 6.5–8.1)

## 2017-05-01 LAB — TSH: TSH: 1.252 u[IU]/mL (ref 0.350–4.500)

## 2017-05-01 LAB — CBC
HCT: 37 % (ref 36.0–46.0)
Hemoglobin: 12.9 g/dL (ref 12.0–15.0)
MCH: 30.2 pg (ref 26.0–34.0)
MCHC: 34.9 g/dL (ref 30.0–36.0)
MCV: 86.7 fL (ref 78.0–100.0)
Platelets: 284 K/uL (ref 150–400)
RBC: 4.27 MIL/uL (ref 3.87–5.11)
RDW: 13.6 % (ref 11.5–15.5)
WBC: 11.8 K/uL — ABNORMAL HIGH (ref 4.0–10.5)

## 2017-05-01 NOTE — MAU Provider Note (Signed)
History   46 yo G5P0040 at 15 2/7 weeks presented after calling with episode of tachycardia upon awakening, with rate up to 150.  BP was 140/81 on home monitor.  Denies chest pain, SOB, d/c, bleeding, cramping, dizziness, or any other sx.  Reports "tried to stop progesterone suppositories", but had episodes of tachycardia on those days, which resolved with insertion of tablet.  Took Labetalol 200 mg this am after episode started.  Saw cardiology 9/5 Cottie Banda Dunn, PA-C, with Dr. Acie Fredrickson as consult): Continue beta blocker, increased dose to 400 mg TID (prior maintenance dose) due to breakthrough palpitations and occasional spikes in BP. F/u in 3 months  Saw endocrinology (Dr. Ladonna Snide at [facility]) on 04/30/17:   TSH studies done, no results available yet.  Next visit 05/28/17  Has MFM appt at [facility] on 05/05/17 for high-risk consult.  Reports "very sensitive" to medications and has wide variations in BP readings and thyroid levels.  She reports "no one believes me about having low BPs".  Upon presentation to MAU, patient reports HR has decreased since initial episode.   CURRENT MEDS: Humulin N 8 U in am, 12 U at dinner Procardia XL 60 HS Labetalol 200 mg in am, 200 mg in afternoon, and 100 mg hs Synthroid 100 mcg daily Progesterone 200 mg per vagina at hs  Patient Active Problem List   Diagnosis Date Noted  . 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 Diabetes mellitus without complication ([place]) 43/15/4008  . AMA (advanced maternal age) multigravida 35+ 05/01/2017  . Tachycardia 05/01/2017  . Obesity (BMI 30-39.9) 02/05/2016  . Benign essential HTN 01/25/2015  . PVCs (premature ventricular contractions) 01/25/2015  . Heart palpitations 11/21/2014  . Thyroiditis     Chief Complaint  Patient presents with  . Hypertension   HPI:  See above  OB History    Gravida Para Term Preterm AB Living   5       4 0   SAB TAB Ectopic Multiple Live Births   4              Past Medical  History:  Diagnosis Date  . Benign essential HTN 01/25/2015  . Diabetes mellitus without complication ([place])   . Herpes simplex without mention of complication   . Hypertension   . Infertility, female   . Obesity (BMI 30-39.9) 02/05/2016  . PCOS (polycystic ovarian syndrome)   . PVCs (premature ventricular contractions) 01/25/2015  . Thyroiditis    Hashimoto's now with hypothyroidism    Past Surgical History:  Procedure Laterality Date  . TONSILLECTOMY AND ADENOIDECTOMY    . WISDOM TOOTH EXTRACTION      Family History  Problem Relation Age of Onset  . Heart disease Paternal Grandfather   . Lymphoma Paternal Grandmother   . Heart disease Maternal Grandmother   . Heart disease Maternal Grandfather   . Heart disease Father   . Diabetes Father   . Diabetes Mother   . Hypertension Mother   . Heart disease Mother        heart murmu  . Hyperlipidemia Mother   . Breast cancer Maternal Aunt   . Cervical cancer Maternal Aunt     Social History  Substance Use Topics  . Smoking status: Never Smoker  . Smokeless tobacco: Never Used  . Alcohol use No    Allergies: No Known Allergies  Prescriptions Prior to Admission  Medication Sig Dispense Refill Last Dose  . Insulin Zinc Human (NOVOLIN L [place]) Take 20 Units  by mouth daily.   Taking  . labetalol (NORMODYNE) 200 MG tablet Take 2 tablets (400 mg total) by mouth 3 (three) times daily.     Marland Kitchen levothyroxine (SYNTHROID) 100 MCG tablet Take 100 mg by mouth daily.   Taking  . NIFEdipine (PROCARDIA XL/ADALAT-CC) 60 MG 24 hr tablet Take 60 mg by mouth daily.   Taking  . progesterone (PROMETRIUM) 200 MG capsule Take 200 mg by mouth daily. Pt takes 1 capsule by mouth starting on day 13-26.   Taking  . silver sulfADIAZINE (SILVADENE) 1 % cream APPLY TWICE DAILY TO BOTH WOUNDS ON LEFT AND RIGHT FOOT AS DIRECTED  2 Taking    ROS:  Tachycardia Physical Exam   Blood pressure (!) 113/54, pulse 94, height 5\' 8"  (1.727 m), weight 101.6 kg (224 lb),  SpO2 100 %.  Vitals:   05/01/17 0736 05/01/17 0741 05/01/17 0856  BP:  (!) 113/54 (!) 107/55  Pulse:  94 72  TempSrc:  Oral   SpO2:  100%   Weight: 101.6 kg (224 lb)    Height: 5\' 8"  (1.727 m)       Physical Exam  Very talkative, in NAD Chest clear Heart RRR , baseline 99-100 Abd gravid, NT, FH 15 weeks Ext DTR 1+, no clonus, no edema.  FHR 150 by doppler  ED Course  Assessment: IUP at 15 weeks Tachycardia Type 2 DM--on insulin, followed at [facility] Hashimoto's thyroiditis, now hypothyroidism Chronic HTN--on Labetalol and Procardia Obesity Recurrent miscarriage AMA Hx PVCs--followed at [facility]: EKG CBC, CMP, TSH UA Will consult with her cardiologist office after labs resulted.   Donnel Saxon CNM, MSN 05/01/2017 8:10 AM   Addendum:  Results for orders placed or performed during the hospital encounter of 05/01/17 (from the past 24 hour(s))  Urinalysis, Routine w reflex microscopic     Status: Abnormal   Collection Time: 05/01/17  7:36 AM  Result Value Ref Range   Color, Urine STRAW (A) YELLOW   APPearance CLEAR CLEAR   Specific Gravity, Urine 1.005 1.005 - 1.030   pH 6.0 5.0 - 8.0   Glucose, UA NEGATIVE NEGATIVE mg/dL   Hgb urine dipstick NEGATIVE NEGATIVE   Bilirubin Urine NEGATIVE NEGATIVE   Ketones, ur NEGATIVE NEGATIVE mg/dL   Protein, ur NEGATIVE NEGATIVE mg/dL   Nitrite NEGATIVE NEGATIVE   Leukocytes, UA NEGATIVE NEGATIVE  TSH     Status: None   Collection Time: 05/01/17  7:52 AM  Result Value Ref Range   TSH 1.252 0.350 - 4.500 uIU/mL  CBC     Status: Abnormal   Collection Time: 05/01/17  7:52 AM  Result Value Ref Range   WBC 11.8 (H) 4.0 - 10.5 K/uL   RBC 4.27 3.87 - 5.11 MIL/uL   Hemoglobin 12.9 12.0 - 15.0 g/dL   HCT 37.0 36.0 - 46.0 %   MCV 86.7 78.0 - 100.0 fL   MCH 30.2 26.0 - 34.0 pg   MCHC 34.9 30.0 - 36.0 g/dL   RDW 13.6 11.5 - 15.5 %   Platelets 284 150 - 400 K/uL  Comprehensive metabolic panel     Status:  Abnormal   Collection Time: 05/01/17  7:52 AM  Result Value Ref Range   Sodium 136 135 - 145 mmol/L   Potassium 4.2 3.5 - 5.1 mmol/L   Chloride 105 101 - 111 mmol/L   CO2 21 (L) 22 - 32 mmol/L   Glucose, Bld 135 (H) 65 - 99 mg/dL  BUN 9 6 - 20 mg/dL   Creatinine, Ser 0.62 0.44 - 1.00 mg/dL   Calcium 9.1 8.9 - 10.3 mg/dL   Total Protein 7.1 6.5 - 8.1 g/dL   Albumin 3.7 3.5 - 5.0 g/dL   AST 17 15 - 41 U/L   ALT 22 14 - 54 U/L   Alkaline Phosphatase 49 38 - 126 U/L   Total Bilirubin 0.5 0.3 - 1.2 mg/dL   GFR calc non Af Amer >60 >60 mL/min   GFR calc Af Amer >60 >60 mL/min   Anion gap 10 5 - 15   Consulted with Dr. Marlou Porch, [facility]: He reviewed EKG, no significant findings. Recommended patient stop Procardia, continue Labetalol 400 mg po TID, and take Labetalol dose early if tachycardia occurs again.  Reviewed recommendation with patient--she declines stopping Procardia, due to hx of severe elevations of BP in past prior to initiation of Procardia.  She will continue current regimen of Labetalol 400 mg po TID (evening dose at 11p) and Procardia 60 XL at 9p.  She will record her BPs and any associated sx, and f/u as scheduled. Will keep scheduled appts next week with MFM and Dr. Cletis Media.  To call with any issues or concerns.  Donnel Saxon, CNM 05/01/17 10a

## 2017-05-01 NOTE — MAU Note (Signed)
Donnel Saxon CNM at bedside, patient presents with elevated heart rate, palpations.

## 2017-05-01 NOTE — Discharge Instructions (Signed)
Sinus Tachycardia °Sinus tachycardia is a kind of fast heartbeat. In sinus tachycardia, the heart beats more than 100 times a minute. Sinus tachycardia starts in a part of the heart called the sinus node. Sinus tachycardia may be harmless, or it may be a sign of a serious condition. °What are the causes? °This condition may be caused by: °· Exercise or exertion. °· A fever. °· Pain. °· Loss of body fluids (dehydration). °· Severe bleeding (hemorrhage). °· Anxiety and stress. °· Certain substances, including: °? Alcohol. °? Caffeine. °? Tobacco and nicotine products. °? Diet pills. °? Illegal drugs. °· Medical conditions including: °? Heart disease. °? An infection. °? An overactive thyroid (hyperthyroidism). °? A lack of red blood cells (anemia). ° °What are the signs or symptoms? °Symptoms of this condition include: °· A feeling that the heart is beating quickly (palpitations). °· Suddenly noticing your heartbeat (cardiac awareness). °· Dizziness. °· Tiredness (fatigue). °· Shortness of breath. °· Chest pain. °· Nausea. °· Fainting. ° °How is this diagnosed? °This condition is diagnosed with: °· A physical exam. °· Other tests, such as: °? Blood tests. °? An electrocardiogram (ECG). This test measures the electrical activity of the heart. °? Holter monitoring. For this test, you wear a device that records your heartbeat for one or more days. ° °You may be referred to a heart specialist (cardiologist). °How is this treated? °Treatment for this condition depends on the cause or underlying condition. Treatment may involve: °· Treating the underlying condition. °· Taking new medicines or changing your current medicines as told by your health care provider. °· Making changes to your diet or lifestyle. °· Practicing relaxation methods. ° °Follow these instructions at home: °Lifestyle °· Do not use any products that contain nicotine or tobacco, such as cigarettes and e-cigarettes. If you need help quitting, ask your  health care provider. °· Learn relaxation methods, like deep breathing, to help you when you get stressed or anxious. °· Do not use illegal drugs, such as cocaine. °· Do not abuse alcohol. Limit alcohol intake to no more than 1 drink a day for non-pregnant women and 2 drinks a day for men. One drink equals 12 oz of beer, 5 oz of wine, or 1½ oz of hard liquor. °· Find time to rest and relax often. This reduces stress. °· Avoid: °? Caffeine. °? Stimulants such as over-the-counter diet pills or pills that help you to stay awake. °? Situations that cause anxiety or stress. °General instructions °· Drink enough fluids to keep your urine clear or pale yellow. °· Take over-the-counter and prescription medicines only as told by your health care provider. °· Keep all follow-up visits as told by your health care provider. This is important. °Contact a health care provider if: °· You have a fever. °· You have vomiting or diarrhea that keeps happening (is persistent). °Get help right away if: °· You have pain in your chest, upper arms, jaw, or neck. °· You become weak or dizzy. °· You feel faint. °· You have palpitations that do not go away. °This information is not intended to replace advice given to you by your health care provider. Make sure you discuss any questions you have with your health care provider. °Document Released: 09/18/2004 Document Revised: 03/08/2016 Document Reviewed: 02/23/2015 °Elsevier Interactive Patient Education © 2018 [employer] ° °

## 2017-05-05 ENCOUNTER — Encounter (HOSPITAL_COMMUNITY): Payer: Self-pay

## 2017-05-05 ENCOUNTER — Ambulatory Visit (HOSPITAL_COMMUNITY)
Admission: RE | Admit: 2017-05-05 | Discharge: 2017-05-05 | Disposition: A | Discharge status: Home / Self Care | Payer: BLUE CROSS/BLUE SHIELD | Source: Ambulatory Visit | Attending: Family Medicine | Admitting: Family Medicine

## 2017-05-05 ENCOUNTER — Ambulatory Visit (HOSPITAL_COMMUNITY)
Admission: RE | Admit: 2017-05-05 | Discharge: 2017-05-05 | Disposition: A | Discharge status: Home / Self Care | Payer: BLUE CROSS/BLUE SHIELD | Source: Ambulatory Visit | Attending: Obstetrics and Gynecology | Admitting: Obstetrics and Gynecology

## 2017-05-05 ENCOUNTER — Other Ambulatory Visit (HOSPITAL_COMMUNITY): Payer: Self-pay | Admitting: Obstetrics and Gynecology

## 2017-05-05 ENCOUNTER — Other Ambulatory Visit (HOSPITAL_COMMUNITY): Payer: Self-pay | Admitting: *Deleted

## 2017-05-05 DIAGNOSIS — O99212 Obesity complicating pregnancy, second trimester: Secondary | ICD-10-CM | POA: Diagnosis not present

## 2017-05-05 DIAGNOSIS — O10919 Unspecified pre-existing hypertension complicating pregnancy, unspecified trimester: Secondary | ICD-10-CM

## 2017-05-05 DIAGNOSIS — O10019 Pre-existing essential hypertension complicating pregnancy, unspecified trimester: Secondary | ICD-10-CM | POA: Insufficient documentation

## 2017-05-05 DIAGNOSIS — O09522 Supervision of elderly multigravida, second trimester: Secondary | ICD-10-CM | POA: Diagnosis present

## 2017-05-05 DIAGNOSIS — O24112 Pre-existing diabetes mellitus, type 2, in pregnancy, second trimester: Secondary | ICD-10-CM | POA: Diagnosis not present

## 2017-05-05 DIAGNOSIS — Z3A15 15 weeks gestation of pregnancy: Secondary | ICD-10-CM

## 2017-05-05 DIAGNOSIS — Z8759 Personal history of other complications of pregnancy, childbirth and the puerperium: Secondary | ICD-10-CM | POA: Insufficient documentation

## 2017-05-05 DIAGNOSIS — O99280 Endocrine, nutritional and metabolic diseases complicating pregnancy, unspecified trimester: Secondary | ICD-10-CM | POA: Diagnosis not present

## 2017-05-05 DIAGNOSIS — O99282 Endocrine, nutritional and metabolic diseases complicating pregnancy, second trimester: Secondary | ICD-10-CM

## 2017-05-05 DIAGNOSIS — Z6834 Body mass index (BMI) 34.0-34.9, adult: Secondary | ICD-10-CM | POA: Diagnosis not present

## 2017-05-05 DIAGNOSIS — E063 Autoimmune thyroiditis: Secondary | ICD-10-CM

## 2017-05-05 DIAGNOSIS — E039 Hypothyroidism, unspecified: Secondary | ICD-10-CM | POA: Insufficient documentation

## 2017-05-05 DIAGNOSIS — Z363 Encounter for antenatal screening for malformations: Secondary | ICD-10-CM | POA: Diagnosis not present

## 2017-05-05 DIAGNOSIS — E038 Other specified hypothyroidism: Secondary | ICD-10-CM

## 2017-05-05 DIAGNOSIS — I10 Essential (primary) hypertension: Secondary | ICD-10-CM

## 2017-05-05 DIAGNOSIS — E079 Disorder of thyroid, unspecified: Secondary | ICD-10-CM

## 2017-05-05 DIAGNOSIS — O09529 Supervision of elderly multigravida, unspecified trimester: Secondary | ICD-10-CM

## 2017-05-05 NOTE — Progress Notes (Signed)
Maternal Fetal Medicine Consultation  Requesting Provider(s): [place]  Primary OB: [facility] Reason for consultation: AMA, type 2 DM, CHTN, Hashimoto's thyroiditis, habitual aborter  HPI: 46yo P0040 at 15+6 weeks. She will be 46 years old at the time of delivery, and has already undergone NIPT with a low risk result. She has type 2 diabetes and is in the hypothyroid phase of Hashimoto's. She is under the care of Dr. Francene Finders at [facility] and currently on Humulin NPH 8 units in the AM and 14 units in the PM as well as 141mcg of synthroid. Her most recent HgbA1C was 5.2% on 8/16. Her most recent free T4 and TSH were normal. Dr. Francene Finders plans to continue seeing her during the pregnancy on a monthly basis. She has CHTN and is now on Labetalol which she takes 200mg  AM and PM and 100mg  at HS, as well as nifedipine XL 60mg  at HS. She is currently collecting her 24 hour urine, she has not started aspirin OB History: OB History    Gravida Para Term Preterm AB Living   5       4 0   SAB TAB Ectopic Multiple Live Births   4            She has had a chemical pregnancy in 1996, a 9 week loss with FHR in 2004, an 8 week loss with FHT in 2010, and an anembryonic pregnancy in 2013. All miscarried spontaneously   PMH:  Past Medical History:  Diagnosis Date  . Benign essential HTN 01/25/2015  . Diabetes mellitus without complication ([place])   . Herpes simplex without mention of complication   . Hypertension   . Hypothyroidism   . Infertility, female   . Obesity (BMI 30-39.9) 02/05/2016  . PCOS (polycystic ovarian syndrome)   . PVCs (premature ventricular contractions) 01/25/2015  . Thyroiditis    Hashimoto's now with hypothyroidism    PSH:  Past Surgical History:  Procedure Laterality Date  . TONSILLECTOMY AND ADENOIDECTOMY    . WISDOM TOOTH EXTRACTION     Meds: See EPIC section Allergies: NKDA  FH: See EPIC section Soc: See EPIC section  Review of Systems: no vaginal bleeding or  cramping/contractions, no LOF, no nausea/vomiting. All other systems reviewed and are negative.   PE:  VS: See EPIC section GEN: well-appearing female ABD: gravid, NT  Please see separate document for fetal ultrasound report.  A/P: 1. 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 She has very high levels of TPO autoantibodies which may have accounted for some of the previous losses 2. AMA: she has had a low-risk NIPT. She understands the gross risk for aneuploidy is about 1 in 10,000. I discussed amniocentesis availablity if she wished further testing and reassurance, and she was not interested in pursuing tat at this time. Due to her age >44, antepartum testing is indicated after 36 weeks, but this is superceded by her other high-risk conditions 3. Hashimoto's: we will defer to her medical endocrinologist for management. Testing TSH every month is certainly warranted. 4. Chronic HTN: She is on appropriate meds for pregnancy, with a good BP at our visit today. She has c. 25% risk for superimposed PIH and Dr. Cletis Media is in the process of obtaining baseline [place] labs. I have asked her to begin 81mg  ASA daily. In conjunction with the monitoring needed for her diabetes, monthly evaluation of growth by [place] is indicated, as in initiation of antepartum  testing at 30 weeks. This can be either weekly BPP or twice weekly NST, and can be performed here or in your office at your discretion. 5. Type 2 DM: she is in adequate control at present. She knows target values for fasting and 2 hour postprandials (<95/<120). She will be seeing Dr. Francene Finders monthly and I instructed her to bring in her blood sugar log to every visit in your office for review. I have made an appointment for fetal echocardiogram at the appropriate [facility].  I have made an appointment for her to return in 4 weeks for a complete anatomic survey. After that visit she needs growth evaluations every 4  weeks. Please let us know if you will be performing those, and the antenatal testing, in your office or if you would like [place] to schedule them here in the [facility] [place]  Thank you for the opportunity to be a part of the care of [facility]. Please contact our office if we can be of further assistance.   I spent approximately 30 minutes with this patient with over 50% of time spent in face-to-face counseling.

## 2017-06-02 ENCOUNTER — Ambulatory Visit (HOSPITAL_COMMUNITY): Payer: BLUE CROSS/BLUE SHIELD

## 2017-06-19 ENCOUNTER — Encounter (HOSPITAL_COMMUNITY): Payer: Self-pay

## 2017-09-28 ENCOUNTER — Encounter (HOSPITAL_COMMUNITY): Payer: Self-pay | Admitting: *Deleted

## 2017-09-28 ENCOUNTER — Telehealth (HOSPITAL_COMMUNITY): Payer: Self-pay | Admitting: *Deleted

## 2017-09-28 NOTE — Telephone Encounter (Signed)
Preadmission screen  

## 2017-10-05 ENCOUNTER — Telehealth (HOSPITAL_COMMUNITY): Payer: Self-pay | Admitting: *Deleted

## 2017-10-05 ENCOUNTER — Encounter (HOSPITAL_COMMUNITY): Payer: Self-pay | Admitting: *Deleted

## 2017-10-05 LAB — OB RESULTS CONSOLE GBS: GBS: POSITIVE

## 2017-10-05 NOTE — Telephone Encounter (Signed)
Preadmission screen  

## 2017-10-07 ENCOUNTER — Other Ambulatory Visit: Payer: Self-pay | Admitting: Obstetrics & Gynecology

## 2017-10-11 ENCOUNTER — Inpatient Hospital Stay (HOSPITAL_COMMUNITY): Admission: RE | Admit: 2017-10-11 | Payer: BLUE CROSS/BLUE SHIELD | Source: Ambulatory Visit

## 2017-10-11 ENCOUNTER — Encounter (HOSPITAL_COMMUNITY): Payer: Self-pay

## 2017-10-11 ENCOUNTER — Inpatient Hospital Stay (HOSPITAL_COMMUNITY)
Admission: RE | Admit: 2017-10-11 | Discharge: 2017-10-14 | DRG: 784 | Disposition: A | Discharge status: Home / Self Care | Payer: BLUE CROSS/BLUE SHIELD | Source: Ambulatory Visit | Attending: Obstetrics and Gynecology | Admitting: Obstetrics and Gynecology

## 2017-10-11 ENCOUNTER — Inpatient Hospital Stay (HOSPITAL_COMMUNITY)
Admission: AD | Admit: 2017-10-11 | Payer: BLUE CROSS/BLUE SHIELD | Source: Ambulatory Visit | Admitting: Obstetrics and Gynecology

## 2017-10-11 DIAGNOSIS — O99824 Streptococcus B carrier state complicating childbirth: Secondary | ICD-10-CM | POA: Diagnosis present

## 2017-10-11 DIAGNOSIS — Z302 Encounter for sterilization: Secondary | ICD-10-CM | POA: Diagnosis not present

## 2017-10-11 DIAGNOSIS — O99214 Obesity complicating childbirth: Secondary | ICD-10-CM | POA: Diagnosis present

## 2017-10-11 DIAGNOSIS — O2412 Pre-existing diabetes mellitus, type 2, in childbirth: Secondary | ICD-10-CM | POA: Diagnosis present

## 2017-10-11 DIAGNOSIS — O99284 Endocrine, nutritional and metabolic diseases complicating childbirth: Secondary | ICD-10-CM | POA: Diagnosis present

## 2017-10-11 DIAGNOSIS — E039 Hypothyroidism, unspecified: Secondary | ICD-10-CM | POA: Diagnosis present

## 2017-10-11 DIAGNOSIS — O1002 Pre-existing essential hypertension complicating childbirth: Secondary | ICD-10-CM | POA: Diagnosis present

## 2017-10-11 DIAGNOSIS — O403XX0 Polyhydramnios, third trimester, not applicable or unspecified: Secondary | ICD-10-CM | POA: Diagnosis present

## 2017-10-11 DIAGNOSIS — Z794 Long term (current) use of insulin: Secondary | ICD-10-CM | POA: Diagnosis not present

## 2017-10-11 DIAGNOSIS — E119 Type 2 diabetes mellitus without complications: Secondary | ICD-10-CM | POA: Diagnosis present

## 2017-10-11 DIAGNOSIS — Z3A38 38 weeks gestation of pregnancy: Secondary | ICD-10-CM

## 2017-10-11 DIAGNOSIS — O10913 Unspecified pre-existing hypertension complicating pregnancy, third trimester: Secondary | ICD-10-CM | POA: Diagnosis present

## 2017-10-11 DIAGNOSIS — E669 Obesity, unspecified: Secondary | ICD-10-CM | POA: Diagnosis present

## 2017-10-11 LAB — CBC
HCT: 35.9 % — ABNORMAL LOW (ref 36.0–46.0)
Hemoglobin: 12.5 g/dL (ref 12.0–15.0)
MCH: 30.2 pg (ref 26.0–34.0)
MCHC: 34.8 g/dL (ref 30.0–36.0)
MCV: 86.7 fL (ref 78.0–100.0)
PLATELETS: 258 10*3/uL (ref 150–400)
RBC: 4.14 MIL/uL (ref 3.87–5.11)
RDW: 14.2 % (ref 11.5–15.5)
WBC: 11.8 10*3/uL — AB (ref 4.0–10.5)

## 2017-10-11 LAB — TYPE AND SCREEN
ABO/RH(D): A POS
Antibody Screen: NEGATIVE

## 2017-10-11 LAB — GLUCOSE, CAPILLARY
Glucose-Capillary: 68 mg/dL (ref 65–99)
Glucose-Capillary: 92 mg/dL (ref 65–99)
Glucose-Capillary: 70 mg/dL (ref 65–99)
Glucose-Capillary: 130 mg/dL — ABNORMAL HIGH (ref 65–99)
Glucose-Capillary: 103 mg/dL — ABNORMAL HIGH (ref 65–99)

## 2017-10-11 LAB — ABO/RH: ABO/RH(D): A POS

## 2017-10-11 MED ORDER — ACETAMINOPHEN 325 MG PO TABS: 650.0000 mg | ORAL_TABLET | ORAL | Status: DC | PRN

## 2017-10-11 MED ORDER — LABETALOL HCL 200 MG PO TABS
300.0000 mg | ORAL_TABLET | ORAL | Status: DC
  Filled 2017-10-11: qty 1

## 2017-10-11 MED ORDER — LABETALOL HCL 5 MG/ML IV SOLN: 20.0000 mg | INTRAVENOUS | Status: DC | PRN

## 2017-10-11 MED ORDER — PENICILLIN G POT IN DEXTROSE 60000 UNIT/ML IV SOLN
3.0000 10*6.[IU] | INTRAVENOUS | Status: DC
  Administered 2017-10-11 – 2017-10-12 (×8): 3 10*6.[IU] via INTRAVENOUS
  Filled 2017-10-11 (×10): qty 50

## 2017-10-11 MED ORDER — HYDRALAZINE HCL 20 MG/ML IJ SOLN: 10.0000 mg | Freq: Once | INTRAMUSCULAR | Status: DC | PRN

## 2017-10-11 MED ORDER — LABETALOL HCL 200 MG PO TABS: 300.0000 mg | ORAL_TABLET | Freq: Three times a day (TID) | ORAL | Status: DC

## 2017-10-11 MED ORDER — LACTATED RINGERS IV SOLN
500.0000 mL | Freq: Once | INTRAVENOUS | Status: AC
  Administered 2017-10-12: 500 mL via INTRAVENOUS

## 2017-10-11 MED ORDER — INSULIN NPH (HUMAN) (ISOPHANE) 100 UNIT/ML ~~LOC~~ SUSP
20.0000 [IU] | Freq: Two times a day (BID) | SUBCUTANEOUS | Status: DC
  Administered 2017-10-11: 20 [IU] via SUBCUTANEOUS
  Filled 2017-10-11: qty 10

## 2017-10-11 MED ORDER — OXYTOCIN 40 UNITS IN LACTATED RINGERS INFUSION - SIMPLE MED
1.0000 m[IU]/min | INTRAVENOUS | Status: DC
  Administered 2017-10-11: 1 m[IU]/min via INTRAVENOUS
  Filled 2017-10-11: qty 1000

## 2017-10-11 MED ORDER — ONDANSETRON HCL 4 MG/2ML IJ SOLN: 4.0000 mg | Freq: Four times a day (QID) | INTRAMUSCULAR | Status: DC | PRN

## 2017-10-11 MED ORDER — LEVOTHYROXINE SODIUM 100 MCG PO TABS
100.0000 ug | ORAL_TABLET | ORAL | Status: DC
  Administered 2017-10-11 – 2017-10-13 (×3): 100 ug via ORAL
  Filled 2017-10-11 (×4): qty 1

## 2017-10-11 MED ORDER — OXYTOCIN 40 UNITS IN LACTATED RINGERS INFUSION - SIMPLE MED: 2.5000 [IU]/h | INTRAVENOUS | Status: DC

## 2017-10-11 MED ORDER — EPHEDRINE 5 MG/ML INJ: 10.0000 mg | INTRAVENOUS | Status: DC | PRN

## 2017-10-11 MED ORDER — FENTANYL 2.5 MCG/ML BUPIVACAINE 1/10 % EPIDURAL INFUSION (WH - ANES)
14.0000 mL/h | INTRAMUSCULAR | Status: DC | PRN
  Administered 2017-10-12 (×2): 14 mL/h via EPIDURAL

## 2017-10-11 MED ORDER — TERBUTALINE SULFATE 1 MG/ML IJ SOLN: 0.2500 mg | Freq: Once | INTRAMUSCULAR | Status: DC | PRN

## 2017-10-11 MED ORDER — PHENYLEPHRINE 40 MCG/ML (10ML) SYRINGE FOR IV PUSH (FOR BLOOD PRESSURE SUPPORT): 80.0000 ug | PREFILLED_SYRINGE | INTRAVENOUS | Status: DC | PRN

## 2017-10-11 MED ORDER — DIPHENHYDRAMINE HCL 50 MG/ML IJ SOLN: 12.5000 mg | INTRAMUSCULAR | Status: DC | PRN

## 2017-10-11 MED ORDER — LABETALOL HCL 200 MG PO TABS
300.0000 mg | ORAL_TABLET | ORAL | Status: DC
  Administered 2017-10-11 – 2017-10-14 (×9): 300 mg via ORAL
  Filled 2017-10-11 (×2): qty 1

## 2017-10-11 MED ORDER — LACTATED RINGERS IV SOLN
500.0000 mL | INTRAVENOUS | Status: DC | PRN
  Administered 2017-10-12: 500 mL via INTRAVENOUS

## 2017-10-11 MED ORDER — FENTANYL CITRATE (PF) 100 MCG/2ML IJ SOLN
50.0000 ug | INTRAMUSCULAR | Status: DC | PRN
  Administered 2017-10-11: 50 ug via INTRAVENOUS
  Filled 2017-10-11: qty 2

## 2017-10-11 MED ORDER — OXYTOCIN BOLUS FROM INFUSION: 500.0000 mL | Freq: Once | INTRAVENOUS | Status: DC

## 2017-10-11 MED ORDER — PENICILLIN G POTASSIUM 5000000 UNITS IJ SOLR
5.0000 10*6.[IU] | Freq: Once | INTRAMUSCULAR | Status: AC
  Administered 2017-10-11: 5 10*6.[IU] via INTRAVENOUS
  Filled 2017-10-11: qty 5

## 2017-10-11 MED ORDER — LEVOTHYROXINE SODIUM 100 MCG PO TABS: 100.0000 ug | ORAL_TABLET | Freq: Every day | ORAL | Status: DC

## 2017-10-11 MED ORDER — MISOPROSTOL 25 MCG QUARTER TABLET
25.0000 ug | ORAL_TABLET | ORAL | Status: DC | PRN
  Administered 2017-10-11: 25 ug via VAGINAL
  Filled 2017-10-11: qty 1

## 2017-10-11 MED ORDER — NIFEDIPINE ER 60 MG PO TB24: 60.0000 mg | ORAL_TABLET | Freq: Every day | ORAL | Status: DC

## 2017-10-11 MED ORDER — LIDOCAINE HCL (PF) 1 % IJ SOLN: 30.0000 mL | INTRAMUSCULAR | Status: DC | PRN

## 2017-10-11 MED ORDER — PHENYLEPHRINE 40 MCG/ML (10ML) SYRINGE FOR IV PUSH (FOR BLOOD PRESSURE SUPPORT)
80.0000 ug | PREFILLED_SYRINGE | INTRAVENOUS | Status: DC | PRN
  Administered 2017-10-12: 80 ug via INTRAVENOUS

## 2017-10-11 MED ORDER — SOD CITRATE-CITRIC ACID 500-334 MG/5ML PO SOLN
30.0000 mL | ORAL | Status: DC | PRN
  Administered 2017-10-12: 30 mL via ORAL
  Filled 2017-10-11: qty 15

## 2017-10-11 MED ORDER — FLEET ENEMA 7-19 GM/118ML RE ENEM: 1.0000 | ENEMA | RECTAL | Status: DC | PRN

## 2017-10-11 MED ORDER — LACTATED RINGERS IV SOLN
INTRAVENOUS | Status: DC
  Administered 2017-10-11: 125 mL/h via INTRAVENOUS
  Administered 2017-10-12 (×4): via INTRAVENOUS

## 2017-10-11 MED ORDER — LEVOTHYROXINE SODIUM 100 MCG PO TABS
100.0000 ug | ORAL_TABLET | Freq: Every day | ORAL | Status: DC
  Filled 2017-10-11: qty 1

## 2017-10-11 MED ORDER — NIFEDIPINE ER OSMOTIC RELEASE 30 MG PO TB24
60.0000 mg | ORAL_TABLET | ORAL | Status: DC
  Administered 2017-10-11 – 2017-10-13 (×3): 60 mg via ORAL
  Filled 2017-10-11: qty 2
  Filled 2017-10-11: qty 1
  Filled 2017-10-11: qty 2
  Filled 2017-10-11: qty 1

## 2017-10-11 NOTE — Anesthesia Pain Management Evaluation Note (Signed)
  CRNA Pain Management Visit Note  Patient: Jordan Reeves, 47 y.o., female  "Hello I am a member of the anesthesia team at [facility]. We have an anesthesia team available at all times to provide care throughout the hospital, including epidural management and anesthesia for C-section. I don't know your plan for the delivery whether it a natural birth, water birth, IV sedation, nitrous supplementation, doula or epidural, but we want to meet your pain goals."   1.Was your pain managed to your expectations on prior hospitalizations?   No prior hospitalizations  2.What is your expectation for pain management during this hospitalization?     Epidural  3.How can we help you reach that goal? Patient wants to try natural but is open to epidural  Record the patient's initial score and the patient's pain goal.   Pain: 1  Pain Goal: 10 [facility] wants you to be able to say your pain was always managed very well.  Rayvon Char 10/11/2017

## 2017-10-11 NOTE — H&P (Addendum)
Jordan Reeves is a 47 y.o. female presenting for induction of labor for her hx of habitual abortions, Type 2 DM, CHTN.  Patient scheduled at 74 W 4 D per Dr. Cletis Media who consulted with MFM. [id number], hx of 4 first trimester spontaneous abortions, LMP 01/15/17, EDD 10/21/17.    OB History    Gravida Para Term Preterm AB Living   5       4 0   SAB TAB Ectopic Multiple Live Births   4             Past Medical History:  Diagnosis Date  . Benign essential HTN 01/25/2015  . Diabetes mellitus without complication ([place])    insuiln currently  . Dysrhythmia    PVCs last EKG NSR 09/2016  . Hypertension   . Hypothyroidism   . Infertility, female   . Obesity (BMI 30-39.9) 02/05/2016  . PCOS (polycystic ovarian syndrome)   . PVCs (premature ventricular contractions) 01/25/2015  . Thyroiditis    Hashimoto's now with hypothyroidism   Past Surgical History:  Procedure Laterality Date  . TONSILLECTOMY AND ADENOIDECTOMY    . WISDOM TOOTH EXTRACTION     Family History: family history includes Breast cancer in her maternal aunt; Cervical cancer in her maternal aunt; Diabetes in her mother; Heart disease in her father, maternal grandfather, maternal grandmother, mother, and paternal grandfather; Hyperlipidemia in her mother; Hypertension in her mother; Lymphoma in her paternal grandmother. Social History:  reports that  has never smoked. she has never used smokeless tobacco. She reports that she does not drink alcohol or use drugs.     Maternal Diabetes: Yes:  Diabetes Type:  Pre-pregnancy, Insulin/Medication controlled Genetic Screening: Normal Maternal Ultrasounds/Referrals: Abnormal:  Findings:   Other: Polyhydramnios, 10/06/17: AFI 26cm.  Fetal Ultrasounds or other Referrals:  Fetal echo, Referred to Materal Fetal Medicine  Maternal Substance Abuse:  No Significant Maternal Medications:  Meds include: Syntroid Other: Labetalol PO 300 mg TID.  Procardia XL 60 mg Qhs.  Asprin 81 mg Q daily. Humulin Q AM and  Q PM.  Significant Maternal Lab Results:  Lab values include: Group B Strep positive. HgbA1C 04/09/17: 5.2 Other Comments:  None  ROS  Constitutional: Denies fevers/chills Cardiovascular: Denies chest pain or palpitations Pulmonary: Denies coughing or wheezing Gastrointestinal: Denies nausea, vomiting or diarrhea Genitourinary: Denies pelvic pain, unusual vaginal bleeding, unusual vaginal discharge, dysuria, urgency or frequency.  Musculoskeletal: Denies muscle or joint aches and pain.  Neurology: Denies abnormal sensations such as tingling or numbness. Denies headache.   History Dilation: 1 Effacement (%): 50 Station: -2 Exam by:: Dorinda Teague RN Blood pressure 129/71, pulse 70, temperature 98.8 F (37.1 C), temperature source Oral, resp. rate 16, height 5\' 8"  (1.727 m), weight 108.9 kg (240 lb), last menstrual period 01/15/2017. Exam Physical Exam  Constitutional: She is oriented to person, place, and time. She appears well-developed and well-nourished.  HENT:  Head: Normocephalic and atraumatic.  Neck: Normal range of motion.  Cardiovascular: Normal rate, regular rhythm and normal heart sounds.   Respiratory: Effort normal and breath sounds normal.  GI: Soft. Bowel sounds are normal.   Genitourinary: Gravid uterus  Neurological: She is alert and oriented to person, place, and time.  Skin: Skin is warm and dry.  Psychiatric: She has a normal mood and affect. Her behavior is normal.   Prenatal labs: ABO, Rh: --/--/A POS (02/17 3086) Antibody: NEG (02/17 0722) Rubella: Immune (08/03 0000) RPR: Nonreactive (08/03 0000)  HBsAg: Negative (08/03 0000)  HIV:  Non-reactive (08/03 0000)  GBS: Positive (02/11 0000)   CBC    Component Value Date/Time   WBC 11.8 (H) 10/11/2017 0800   RBC 4.14 10/11/2017 0800   HGB 12.5 10/11/2017 0800   HGB 14.0 10/06/2016 1425   HCT 35.9 (L) 10/11/2017 0800   HCT 40.7 10/06/2016 1425   PLT 258 10/11/2017 0800   PLT 350 10/06/2016 1425    MCV 86.7 10/11/2017 0800   MCV 85 10/06/2016 1425   MCH 30.2 10/11/2017 0800   MCHC 34.8 10/11/2017 0800   RDW 14.2 10/11/2017 0800   RDW 14.2 10/06/2016 1425   LYMPHSABS 2.1 10/06/2016 1425   MONOABS 0.7 12/03/2014 0850   EOSABS 0.1 10/06/2016 1425   BASOSABS 0.1 10/06/2016 1425   FSG: 68.   Assessment/Plan: 47 y/o G5P0040 @ 48 W 4 D EGA here for Induction of labor,  Admit to Labor and delivery - I discussed with patient risks, benefits and alternatives of labor induction including higher risk of cesarean delivery compared to spontaneous labor.  We discussed risks of induction agents including effects on fetal heart beat, contraction pattern and need for close monitoring.  Patient expressed understanding of all this and desired to proceed with the induction.  - FSG check Q 4 hrs in latent phase of labor then Q 2 hours in active phase of labor.  -GBS prophylaxis when in active labor or foley catheter inserted.    Alinda Dooms, MD.  10/11/2017, 9:41 AM

## 2017-10-11 NOTE — Progress Notes (Addendum)
Jordan Reeves is a 47 y.o. [id number] at [redacted]w[redacted]d here for induction of labor for history of habitual abortions, pre- gestational DM, CHTN  Subjective: Patient feels some abdominal cramping.    Objective: BP (!) 159/76 (BP Location: Right Arm)   Pulse 75   Temp 98.4 F (36.9 C) (Oral)   Resp 16   Ht 5\' 8"  (1.727 m)   Wt 108.9 kg (240 lb)   LMP 01/15/2017 (Exact Date)   BMI 36.49 kg/m  No intake/output data recorded. No intake/output data recorded.  FHT:  FHR: 120 bpm, variability: moderate,  accelerations:  Present,  decelerations:  Absent UC:   regular, every 1- 3 minutes SVE:   Dilation: 1 Effacement (%): 50 Station: -2 Exam by:: MD Jodye Scali Foley balloon catheter placed in at 1350.  60 cc NS instilled.    Labs: Lab Results  Component Value Date   WBC 11.8 (H) 10/11/2017   HGB 12.5 10/11/2017   HCT 35.9 (L) 10/11/2017   MCV 86.7 10/11/2017   PLT 258 10/11/2017   Scheduled Meds  . insulin NPH Human  20 Units Subcutaneous BID AC & HS  . labetalol  300 mg Oral 3 times per day  . levothyroxine  100 mcg Oral Q24H  . NIFEdipine  60 mg Oral Q24H  . oxytocin 40 units in LR 1000 mL  500 mL Intravenous Once    Assessment / Plan: Induction of labor due to [facility] medical conditions,  progressing well on pitocin  Labor: Progressing normally and plan for pitocin start once contractions space out.  Fetal Wellbeing:  Category I Pain Control:  Labor support without medications, Epidural and IV pain meds I/D:  GBS prophylaxis. Anticipated MOD:  NSVD    Alinda Dooms,  MD 10/11/2017, 1:51 PM

## 2017-10-12 ENCOUNTER — Inpatient Hospital Stay (HOSPITAL_COMMUNITY): Payer: BLUE CROSS/BLUE SHIELD | Admitting: Anesthesiology

## 2017-10-12 ENCOUNTER — Encounter (HOSPITAL_COMMUNITY): Payer: Self-pay

## 2017-10-12 ENCOUNTER — Encounter (HOSPITAL_COMMUNITY)
Admission: RE | Disposition: A | Discharge status: Home / Self Care | Payer: Self-pay | Source: Ambulatory Visit | Attending: Obstetrics and Gynecology

## 2017-10-12 ENCOUNTER — Other Ambulatory Visit: Payer: Self-pay

## 2017-10-12 LAB — CBC
HEMATOCRIT: 36.5 % (ref 36.0–46.0)
HEMATOCRIT: 33.5 % — AB (ref 36.0–46.0)
HEMOGLOBIN: 12.5 g/dL (ref 12.0–15.0)
Hemoglobin: 11.5 g/dL — ABNORMAL LOW (ref 12.0–15.0)
MCH: 30 pg (ref 26.0–34.0)
MCH: 30.1 pg (ref 26.0–34.0)
MCHC: 34.2 g/dL (ref 30.0–36.0)
MCHC: 34.3 g/dL (ref 30.0–36.0)
MCV: 87.5 fL (ref 78.0–100.0)
MCV: 87.7 fL (ref 78.0–100.0)
PLATELETS: 212 10*3/uL (ref 150–400)
Platelets: 260 10*3/uL (ref 150–400)
RBC: 4.17 MIL/uL (ref 3.87–5.11)
RBC: 3.82 MIL/uL — ABNORMAL LOW (ref 3.87–5.11)
RDW: 14.5 % (ref 11.5–15.5)
RDW: 14.3 % (ref 11.5–15.5)
WBC: 12.6 10*3/uL — ABNORMAL HIGH (ref 4.0–10.5)
WBC: 16.3 10*3/uL — AB (ref 4.0–10.5)

## 2017-10-12 LAB — CREATININE, SERUM
Creatinine, Ser: 0.53 mg/dL (ref 0.44–1.00)
GFR calc non Af Amer: >60 mL/min (ref >60)

## 2017-10-12 LAB — GLUCOSE, CAPILLARY
GLUCOSE-CAPILLARY: 77 mg/dL (ref 65–99)
GLUCOSE-CAPILLARY: 79 mg/dL (ref 65–99)
Glucose-Capillary: 63 mg/dL — ABNORMAL LOW (ref 65–99)
Glucose-Capillary: 72 mg/dL (ref 65–99)
Glucose-Capillary: 72 mg/dL (ref 65–99)
Glucose-Capillary: 66 mg/dL (ref 65–99)
Glucose-Capillary: 83 mg/dL (ref 65–99)
Glucose-Capillary: 76 mg/dL (ref 65–99)
Glucose-Capillary: 90 mg/dL (ref 65–99)

## 2017-10-12 LAB — RPR: RPR: NONREACTIVE

## 2017-10-12 SURGERY — Surgical Case
Anesthesia: Epidural | Wound class: Clean Contaminated

## 2017-10-12 MED ORDER — MENTHOL 3 MG MT LOZG: 1.0000 | LOZENGE | OROMUCOSAL | Status: DC | PRN

## 2017-10-12 MED ORDER — NALBUPHINE HCL 10 MG/ML IJ SOLN: 5.0000 mg | INTRAMUSCULAR | Status: DC | PRN

## 2017-10-12 MED ORDER — OXYTOCIN 10 UNIT/ML IJ SOLN
INTRAVENOUS | Status: DC | PRN
  Administered 2017-10-12: 40 [IU] via INTRAVENOUS

## 2017-10-12 MED ORDER — LACTATED RINGERS IV SOLN
INTRAVENOUS | Status: DC
  Administered 2017-10-12 (×2): via INTRAUTERINE

## 2017-10-12 MED ORDER — BUPIVACAINE HCL (PF) 0.25 % IJ SOLN
INTRAMUSCULAR | Status: DC | PRN
  Administered 2017-10-12: 20 mL

## 2017-10-12 MED ORDER — NALOXONE HCL 0.4 MG/ML IJ SOLN: 0.4000 mg | INTRAMUSCULAR | Status: DC | PRN

## 2017-10-12 MED ORDER — ENOXAPARIN SODIUM 60 MG/0.6ML ~~LOC~~ SOLN
50.0000 mg | SUBCUTANEOUS | Status: DC
  Filled 2017-10-12 (×2): qty 0.6

## 2017-10-12 MED ORDER — ZOLPIDEM TARTRATE 5 MG PO TABS: 5.0000 mg | ORAL_TABLET | Freq: Every evening | ORAL | Status: DC | PRN

## 2017-10-12 MED ORDER — NALBUPHINE HCL 10 MG/ML IJ SOLN: 5.0000 mg | Freq: Once | INTRAMUSCULAR | Status: DC | PRN

## 2017-10-12 MED ORDER — SCOPOLAMINE 1 MG/3DAYS TD PT72: 1.0000 | MEDICATED_PATCH | Freq: Once | TRANSDERMAL | Status: DC

## 2017-10-12 MED ORDER — DEXAMETHASONE SODIUM PHOSPHATE 10 MG/ML IJ SOLN
INTRAMUSCULAR | Status: AC
  Filled 2017-10-12: qty 1

## 2017-10-12 MED ORDER — OXYTOCIN 40 UNITS IN LACTATED RINGERS INFUSION - SIMPLE MED: 1.0000 m[IU]/min | INTRAVENOUS | Status: DC

## 2017-10-12 MED ORDER — KETOROLAC TROMETHAMINE 30 MG/ML IJ SOLN: 30.0000 mg | Freq: Four times a day (QID) | INTRAMUSCULAR | Status: AC | PRN

## 2017-10-12 MED ORDER — INSULIN NPH (HUMAN) (ISOPHANE) 100 UNIT/ML ~~LOC~~ SUSP
10.0000 [IU] | Freq: Two times a day (BID) | SUBCUTANEOUS | Status: DC
  Administered 2017-10-13 – 2017-10-14 (×3): 10 [IU] via SUBCUTANEOUS

## 2017-10-12 MED ORDER — DEXAMETHASONE SODIUM PHOSPHATE 10 MG/ML IJ SOLN
INTRAMUSCULAR | Status: DC | PRN
  Administered 2017-10-12: 10 mg via INTRAVENOUS

## 2017-10-12 MED ORDER — SCOPOLAMINE 1 MG/3DAYS TD PT72
MEDICATED_PATCH | TRANSDERMAL | Status: AC
  Filled 2017-10-12: qty 1

## 2017-10-12 MED ORDER — SIMETHICONE 80 MG PO CHEW: 80.0000 mg | CHEWABLE_TABLET | ORAL | Status: DC | PRN

## 2017-10-12 MED ORDER — PRENATAL MULTIVITAMIN CH
1.0000 | ORAL_TABLET | Freq: Every day | ORAL | Status: DC
  Administered 2017-10-13: 1 via ORAL
  Filled 2017-10-12: qty 1

## 2017-10-12 MED ORDER — SODIUM CHLORIDE 0.9 % IR SOLN
Status: DC | PRN
  Administered 2017-10-12: 1

## 2017-10-12 MED ORDER — TETANUS-DIPHTH-ACELL PERTUSSIS 5-2.5-18.5 LF-MCG/0.5 IM SUSP: 0.5000 mL | Freq: Once | INTRAMUSCULAR | Status: DC

## 2017-10-12 MED ORDER — MEPERIDINE HCL 25 MG/ML IJ SOLN: 6.2500 mg | INTRAMUSCULAR | Status: DC | PRN

## 2017-10-12 MED ORDER — OXYCODONE-ACETAMINOPHEN 5-325 MG PO TABS: 1.0000 | ORAL_TABLET | ORAL | Status: DC | PRN

## 2017-10-12 MED ORDER — DIPHENHYDRAMINE HCL 50 MG/ML IJ SOLN: 12.5000 mg | INTRAMUSCULAR | Status: DC | PRN

## 2017-10-12 MED ORDER — PROMETHAZINE HCL 25 MG/ML IJ SOLN: 6.2500 mg | INTRAMUSCULAR | Status: DC | PRN

## 2017-10-12 MED ORDER — OXYTOCIN 40 UNITS IN LACTATED RINGERS INFUSION - SIMPLE MED: 2.5000 [IU]/h | INTRAVENOUS | Status: AC

## 2017-10-12 MED ORDER — OXYTOCIN 10 UNIT/ML IJ SOLN
INTRAMUSCULAR | Status: AC
  Filled 2017-10-12: qty 4

## 2017-10-12 MED ORDER — METHYLERGONOVINE MALEATE 0.2 MG/ML IJ SOLN: 0.2000 mg | INTRAMUSCULAR | Status: DC | PRN

## 2017-10-12 MED ORDER — ONDANSETRON HCL 4 MG/2ML IJ SOLN
INTRAMUSCULAR | Status: AC
  Filled 2017-10-12: qty 2

## 2017-10-12 MED ORDER — COCONUT OIL OIL: 1.0000 "application " | TOPICAL_OIL | Status: DC | PRN

## 2017-10-12 MED ORDER — DIBUCAINE 1 % RE OINT: 1.0000 "application " | TOPICAL_OINTMENT | RECTAL | Status: DC | PRN

## 2017-10-12 MED ORDER — ACETAMINOPHEN 325 MG PO TABS
650.0000 mg | ORAL_TABLET | ORAL | Status: DC | PRN
  Administered 2017-10-13: 650 mg via ORAL
  Filled 2017-10-12: qty 2

## 2017-10-12 MED ORDER — OXYCODONE-ACETAMINOPHEN 5-325 MG PO TABS: 2.0000 | ORAL_TABLET | ORAL | Status: DC | PRN

## 2017-10-12 MED ORDER — SIMETHICONE 80 MG PO CHEW
80.0000 mg | CHEWABLE_TABLET | Freq: Three times a day (TID) | ORAL | Status: DC
  Administered 2017-10-14: 80 mg via ORAL
  Filled 2017-10-12 (×2): qty 1

## 2017-10-12 MED ORDER — SENNOSIDES-DOCUSATE SODIUM 8.6-50 MG PO TABS
2.0000 | ORAL_TABLET | ORAL | Status: DC
  Administered 2017-10-13: 2 via ORAL
  Filled 2017-10-12 (×2): qty 2

## 2017-10-12 MED ORDER — FENTANYL 2.5 MCG/ML BUPIVACAINE 1/10 % EPIDURAL INFUSION (WH - ANES)
INTRAMUSCULAR | Status: AC
  Filled 2017-10-12: qty 100

## 2017-10-12 MED ORDER — LIDOCAINE-EPINEPHRINE (PF) 2 %-1:200000 IJ SOLN
INTRAMUSCULAR | Status: AC
  Filled 2017-10-12: qty 20

## 2017-10-12 MED ORDER — MORPHINE SULFATE (PF) 0.5 MG/ML IJ SOLN
INTRAMUSCULAR | Status: AC
Start: 2017-10-12 — End: 2017-10-12
  Filled 2017-10-12: qty 10

## 2017-10-12 MED ORDER — SODIUM BICARBONATE 8.4 % IV SOLN
INTRAVENOUS | Status: DC | PRN
  Administered 2017-10-12 (×3): 5 mL via EPIDURAL

## 2017-10-12 MED ORDER — LIDOCAINE HCL (PF) 1 % IJ SOLN
INTRAMUSCULAR | Status: DC | PRN
  Administered 2017-10-12 (×2): 4 mL via EPIDURAL

## 2017-10-12 MED ORDER — KETOROLAC TROMETHAMINE 30 MG/ML IJ SOLN: 30.0000 mg | Freq: Once | INTRAMUSCULAR | Status: DC | PRN

## 2017-10-12 MED ORDER — BUPIVACAINE HCL (PF) 0.25 % IJ SOLN
INTRAMUSCULAR | Status: AC
  Filled 2017-10-12: qty 30

## 2017-10-12 MED ORDER — LACTATED RINGERS IV SOLN: 500.0000 mL | Freq: Once | INTRAVENOUS | Status: DC

## 2017-10-12 MED ORDER — LACTATED RINGERS IV SOLN
INTRAVENOUS | Status: DC
  Administered 2017-10-12 – 2017-10-13 (×2): via INTRAVENOUS

## 2017-10-12 MED ORDER — WITCH HAZEL-GLYCERIN EX PADS: 1.0000 "application " | MEDICATED_PAD | CUTANEOUS | Status: DC | PRN

## 2017-10-12 MED ORDER — IBUPROFEN 600 MG PO TABS
600.0000 mg | ORAL_TABLET | Freq: Four times a day (QID) | ORAL | Status: DC
  Administered 2017-10-14 (×2): 600 mg via ORAL
  Filled 2017-10-12 (×3): qty 1

## 2017-10-12 MED ORDER — METHYLERGONOVINE MALEATE 0.2 MG PO TABS: 0.2000 mg | ORAL_TABLET | ORAL | Status: DC | PRN

## 2017-10-12 MED ORDER — KETOROLAC TROMETHAMINE 30 MG/ML IJ SOLN
INTRAMUSCULAR | Status: AC
  Administered 2017-10-12: 30 mg via INTRAMUSCULAR
  Filled 2017-10-12: qty 1

## 2017-10-12 MED ORDER — MORPHINE SULFATE (PF) 0.5 MG/ML IJ SOLN
INTRAMUSCULAR | Status: DC | PRN
  Administered 2017-10-12: 4 mg via EPIDURAL
  Administered 2017-10-12: 1 mg via INTRAVENOUS

## 2017-10-12 MED ORDER — KETOROLAC TROMETHAMINE 30 MG/ML IJ SOLN
30.0000 mg | Freq: Four times a day (QID) | INTRAMUSCULAR | Status: AC | PRN
  Administered 2017-10-12: 30 mg via INTRAMUSCULAR

## 2017-10-12 MED ORDER — CEFAZOLIN SODIUM-DEXTROSE 2-3 GM-%(50ML) IV SOLR
INTRAVENOUS | Status: DC | PRN
  Administered 2017-10-12: 2 g via INTRAVENOUS

## 2017-10-12 MED ORDER — PHENYLEPHRINE 40 MCG/ML (10ML) SYRINGE FOR IV PUSH (FOR BLOOD PRESSURE SUPPORT)
PREFILLED_SYRINGE | INTRAVENOUS | Status: AC
  Filled 2017-10-12: qty 20

## 2017-10-12 MED ORDER — HYDROMORPHONE HCL 1 MG/ML IJ SOLN: 0.2500 mg | INTRAMUSCULAR | Status: DC | PRN

## 2017-10-12 MED ORDER — CEFAZOLIN SODIUM-DEXTROSE 2-4 GM/100ML-% IV SOLN
INTRAVENOUS | Status: AC
  Filled 2017-10-12: qty 100

## 2017-10-12 MED ORDER — SODIUM BICARBONATE 8.4 % IV SOLN
INTRAVENOUS | Status: AC
  Filled 2017-10-12: qty 50

## 2017-10-12 MED ORDER — DIPHENHYDRAMINE HCL 25 MG PO CAPS: 25.0000 mg | ORAL_CAPSULE | Freq: Four times a day (QID) | ORAL | Status: DC | PRN

## 2017-10-12 MED ORDER — ONDANSETRON HCL 4 MG/2ML IJ SOLN: 4.0000 mg | Freq: Three times a day (TID) | INTRAMUSCULAR | Status: DC | PRN

## 2017-10-12 MED ORDER — DIPHENHYDRAMINE HCL 25 MG PO CAPS: 25.0000 mg | ORAL_CAPSULE | ORAL | Status: DC | PRN

## 2017-10-12 MED ORDER — ACETAMINOPHEN 500 MG PO TABS
1000.0000 mg | ORAL_TABLET | Freq: Four times a day (QID) | ORAL | Status: AC
  Filled 2017-10-12 (×3): qty 2

## 2017-10-12 MED ORDER — MEASLES, MUMPS & RUBELLA VAC ~~LOC~~ INJ: 0.5000 mL | INJECTION | Freq: Once | SUBCUTANEOUS | Status: DC

## 2017-10-12 MED ORDER — SODIUM CHLORIDE 0.9% FLUSH: 3.0000 mL | INTRAVENOUS | Status: DC | PRN

## 2017-10-12 MED ORDER — SCOPOLAMINE 1 MG/3DAYS TD PT72
MEDICATED_PATCH | TRANSDERMAL | Status: DC | PRN
  Administered 2017-10-12: 1 via TRANSDERMAL

## 2017-10-12 MED ORDER — SIMETHICONE 80 MG PO CHEW
80.0000 mg | CHEWABLE_TABLET | ORAL | Status: DC
  Administered 2017-10-13: 80 mg via ORAL
  Filled 2017-10-12 (×2): qty 1

## 2017-10-12 MED ORDER — FERROUS SULFATE 325 (65 FE) MG PO TABS
325.0000 mg | ORAL_TABLET | Freq: Two times a day (BID) | ORAL | Status: DC
  Administered 2017-10-13 – 2017-10-14 (×3): 325 mg via ORAL
  Filled 2017-10-12 (×3): qty 1

## 2017-10-12 MED ORDER — NALOXONE HCL 4 MG/10ML IJ SOLN: 1.0000 ug/kg/h | INTRAVENOUS | Status: DC | PRN

## 2017-10-12 MED ORDER — ONDANSETRON HCL 4 MG/2ML IJ SOLN
INTRAMUSCULAR | Status: DC | PRN
  Administered 2017-10-12: 4 mg via INTRAVENOUS

## 2017-10-12 SURGICAL SUPPLY — 42 items
APL SKNCLS STERI-STRIP NONHPOA (GAUZE/BANDAGES/DRESSINGS) ×1
BENZOIN TINCTURE PRP APPL 2/3 (GAUZE/BANDAGES/DRESSINGS) ×3 IMPLANT
BOOTIES KNEE HIGH SLOAN (MISCELLANEOUS) ×6 IMPLANT
CHLORAPREP W/TINT 26ML (MISCELLANEOUS) ×3 IMPLANT
CLAMP CORD UMBIL (MISCELLANEOUS) IMPLANT
CLOSURE STERI STRIP 1/2 X4 (GAUZE/BANDAGES/DRESSINGS) ×2 IMPLANT
CLOTH BEACON ORANGE TIMEOUT ST (SAFETY) ×3 IMPLANT
DRAIN JACKSON PRT FLT 10 (DRAIN) IMPLANT
DRSG OPSITE POSTOP 4X10 (GAUZE/BANDAGES/DRESSINGS) ×3 IMPLANT
ELECT REM PT RETURN 9FT ADLT (ELECTROSURGICAL) ×3
ELECTRODE REM PT RTRN 9FT ADLT (ELECTROSURGICAL) ×1 IMPLANT
EVACUATOR SILICONE 100CC (DRAIN) IMPLANT
EXTRACTOR VACUUM M CUP 4 TUBE (SUCTIONS) IMPLANT
EXTRACTOR VACUUM M CUP 4' TUBE (SUCTIONS)
GLOVE BIOGEL PI IND STRL 7.0 (GLOVE) ×2 IMPLANT
GLOVE BIOGEL PI INDICATOR 7.0 (GLOVE) ×4
GLOVE ECLIPSE 6.5 STRL STRAW (GLOVE) ×3 IMPLANT
GOWN STRL REUS W/TWL LRG LVL3 (GOWN DISPOSABLE) ×6 IMPLANT
KIT ABG SYR 3ML LUER SLIP (SYRINGE) IMPLANT
NDL HYPO 25X5/8 SAFETYGLIDE (NEEDLE) IMPLANT
NEEDLE HYPO 22GX1.5 SAFETY (NEEDLE) ×3 IMPLANT
NEEDLE HYPO 25X5/8 SAFETYGLIDE (NEEDLE) IMPLANT
NS IRRIG 1000ML POUR BTL (IV SOLUTION) ×6 IMPLANT
PACK C SECTION WH (CUSTOM PROCEDURE TRAY) ×3 IMPLANT
PAD OB MATERNITY 4.3X12.25 (PERSONAL CARE ITEMS) ×3 IMPLANT
PENCIL SMOKE EVAC W/HOLSTER (ELECTROSURGICAL) ×3 IMPLANT
RETRACTOR TRAXI PANNICULUS (MISCELLANEOUS) IMPLANT
RTRCTR C-SECT PINK 25CM LRG (MISCELLANEOUS) ×3 IMPLANT
SPONGE LAP 18X18 X RAY DECT (DISPOSABLE) ×4 IMPLANT
STRIP CLOSURE SKIN 1/2X4 (GAUZE/BANDAGES/DRESSINGS) ×2 IMPLANT
SUT MNCRL AB 3-0 PS2 27 (SUTURE) ×3 IMPLANT
SUT SILK 2 0 FSL 18 (SUTURE) IMPLANT
SUT VIC AB 0 CTX 36 (SUTURE) ×9
SUT VIC AB 0 CTX36XBRD ANBCTRL (SUTURE) ×2 IMPLANT
SUT VIC AB 1 CT1 36 (SUTURE) ×6 IMPLANT
SUT VIC AB 2-0 CT1 27 (SUTURE) ×3
SUT VIC AB 2-0 CT1 TAPERPNT 27 (SUTURE) IMPLANT
SYR 20CC LL (SYRINGE) ×3 IMPLANT
TOWEL OR 17X24 6PK STRL BLUE (TOWEL DISPOSABLE) ×3 IMPLANT
TRAXI PANNICULUS RETRACTOR (MISCELLANEOUS) ×2
TRAY FOLEY BAG SILVER LF 14FR (SET/KITS/TRAYS/PACK) ×3 IMPLANT
WATER STERILE IRR 1000ML POUR (IV SOLUTION) ×2 IMPLANT

## 2017-10-12 NOTE — Op Note (Signed)
Preoperative diagnosis: Intrauterine pregnancy at 38 weeks and 5 days, chronic hypertension, Type 2 diabetes, protracted latent phase of labor, desire for primary cesarean section, desire for sterilization  Post operative diagnosis: Same  Anesthesia: Epidural  Anesthesiologist: Dr. Tobias Alexander  Procedure: Primary low transverse cesarean section  Surgeon: Dr. Katharine Look Latoia Eyster  Assistant: Salomon Mast RNFA  Estimated blood loss: 800 cc  Procedure:  After being informed of the planned procedure and possible complications including bleeding, infection, injury to other organs, informed consent is obtained. The patient is taken to OR #9 and given spinal anesthesia without complication. She is placed in the dorsal decubitus position with the pelvis tilted to the left. She is then prepped and draped in a sterile fashion. A Foley catheter is inserted in her bladder.  After assessing adequate level of anesthesia, we infiltrate the suprapubic area with 20 cc of Marcaine 0.25 and perform a Pfannenstiel incision which is brought down sharply to the fascia. The fascia is entered in a low transverse fashion. Linea alba is dissected. Peritoneum is entered in a midline fashion. An Alexis retractor is easily positioned.   The myometrium is then entered in a low transverse fashion, 2 cm above the vesico-uterine junction ; first with knife and then extended bluntly. Amniotic fluid is clear. We assist the birth of a female  infant in vertex presentation. Mouth and nose are suctioned. The baby is delivered. The cord is clamped and sectioned. The baby is given to the neonatologist present in the room.  10 cc of blood is drawn from the umbilical vein.The placenta is allowed to deliver spontaneously. It is complete and the cord has 3 vessels. Uterine revision is negative.  We proceed with closure of the myometrium in 2 layers: First with a running locked suture of 0 Vicryl, then with a Lembert suture of 0 Vicryl imbricating  the first one. Hemostasis is completed with cauterization on peritoneal edges and 3 figure-of-eight sutures of 0 Vicryl.  Both paracolic gutters are cleaned. Both tubes and ovaries are assessed and normal. The pelvis is profusely irrigated with warm saline to confirm a satisfactory hemostasis.  We then proceed with bilateral salpyngectomy for sterilization.  Each tube is grasped with a Babcock forceps. Using 2 Rogers clamp, we isolate the tube by clamping the mesosalpynx. The tube is sharply excised and the mesosalpynx is sutured with 2 transfixed sutures of 2-0 vicryl. Hemostasis is adequate   Retractors and sponges are removed. Under fascia hemostasis is completed with cauterization. The fascia is then closed with 2 running sutures of 0 Vicryl meeting midline. The wound is irrigated with warm saline and hemostasis is completed with cauterization. The subcuticular layer is closed with interrupted sutures of 0 Plain.The skin is closed with a subcuticular suture of 3-0 Monocryl and Steri-Strips.  Instrument and sponge count is complete x2. Estimated blood loss is 800 cc.  The procedure is well tolerated by the patient who is taken to recovery room in a well and stable condition.  female baby named Wenda Overland was born at 17:36 and received an Apgar of 8  at 1 minute and 9 at 5 minutes.    Specimen: Placenta sent to pathology   Dede Query Elridge Stemm MD 2/18/20194:52 PM

## 2017-10-12 NOTE — Progress Notes (Signed)
Subjective: Pt slightly feeling contractions.  Objective: BP 134/68   Pulse 75   Temp 98 F (36.7 C) (Oral)   Resp 18   Ht 5\' 8"  (1.727 m)   Wt 108.9 kg (240 lb)   LMP 01/15/2017 (Exact Date)   BMI 36.49 kg/m  No intake/output data recorded. No intake/output data recorded.  FHT: Category 1 UC:   regular, every 3 minutes SVE:   Dilation: 5 Effacement (%): 80 Station: -2 Exam by:: Irene Shipper, CNM  Pitocin at 17mu AROM clear fluid  Assessment:  Induction of labor due to [place], Type 2 diabetes, and Chronic HTN Cat 1 strip    Plan: Continue with pitocin.  Monitor progress Hold morning insulin  Pleas Koch Tora Prunty CNM, MSN 10/12/2017, 6:50 AM

## 2017-10-12 NOTE — Anesthesia Postprocedure Evaluation (Signed)
Anesthesia Post Note  Patient: Jordan Reeves  Procedure(s) Performed: CESAREAN SECTION WITH BILATERAL TUBAL LIGATION (N/A )     Patient location during evaluation: Mother Baby Anesthesia Type: Epidural Level of consciousness: awake and alert and oriented Pain management: satisfactory to patient Vital Signs Assessment: post-procedure vital signs reviewed and stable Respiratory status: respiratory function stable Cardiovascular status: stable Postop Assessment: no headache, no backache, epidural receding, patient able to bend at knees, no signs of nausea or vomiting and adequate PO intake Anesthetic complications: no    Last Vitals:  Vitals:   10/12/17 1945 10/12/17 1953  BP: 129/78   Pulse: (!) 30   Resp: 14   Temp:  37.1 C  SpO2: 97%     Last Pain:  Vitals:   10/12/17 2000  TempSrc:   PainSc: 0-No pain   Pain Goal:                 [place]

## 2017-10-12 NOTE — Anesthesia Preprocedure Evaluation (Signed)
Anesthesia Evaluation  Patient identified by MRN, date of birth, ID band Patient awake    Reviewed: Allergy & Precautions, NPO status , Patient's Chart, lab work & pertinent test results  Airway Mallampati: III  TM Distance: >3 FB Neck ROM: Full    Dental no notable dental hx. (+) Teeth Intact   Pulmonary neg pulmonary ROS,    Pulmonary exam normal breath sounds clear to auscultation       Cardiovascular hypertension, Pt. on medications and Pt. on home beta blockers Normal cardiovascular exam+ dysrhythmias  Rhythm:Regular Rate:Normal     Neuro/Psych negative neurological ROS  negative psych ROS   GI/Hepatic Neg liver ROS, GERD  ,  Endo/Other  diabetes, Well Controlled, Gestational, Insulin DependentHypothyroidism Obesity Hx/o Hashimoto's thyroiditis- currently hypothyroid on replacement therapy  Renal/GU negative Renal ROS  negative genitourinary   Musculoskeletal negative musculoskeletal ROS (+)   Abdominal (+) + obese,   Peds  Hematology  (+) anemia ,   Anesthesia Other Findings   Reproductive/Obstetrics (+) Pregnancy                             Anesthesia Physical Anesthesia Plan  ASA: II  Anesthesia Plan: Epidural   Post-op Pain Management:    Induction:   PONV Risk Score and Plan:   Airway Management Planned: Natural Airway  Additional Equipment:   Intra-op Plan:   Post-operative Plan:   Informed Consent: I have reviewed the patients History and Physical, chart, labs and discussed the procedure including the risks, benefits and alternatives for the proposed anesthesia with the patient or authorized representative who has indicated his/her understanding and acceptance.     Plan Discussed with: Anesthesiologist  Anesthesia Plan Comments:         Anesthesia Quick Evaluation

## 2017-10-12 NOTE — Anesthesia Procedure Notes (Signed)
Epidural Patient location during procedure: OB Start time: 10/12/2017 11:43 AM  Staffing Anesthesiologist: Josephine Igo, MD Performed: anesthesiologist   Preanesthetic Checklist Completed: patient identified, site marked, surgical consent, pre-op evaluation, timeout performed, IV checked, risks and benefits discussed and monitors and equipment checked  Epidural Patient position: sitting Prep: site prepped and draped and DuraPrep Patient monitoring: continuous pulse ox and blood pressure Approach: midline Location: L3-L4 Injection technique: LOR air  Needle:  Needle type: Tuohy  Needle gauge: 17 G Needle length: 9 cm and 9 Needle insertion depth: 6 cm Catheter type: closed end flexible Catheter size: 19 Gauge Catheter at skin depth: 11 cm Test dose: negative and Other  Assessment Events: blood not aspirated, injection not painful, no injection resistance, negative IV test and no paresthesia  Additional Notes Patient identified. Risks and benefits discussed including failed block, incomplete  Pain control, post dural puncture headache, nerve damage, paralysis, blood pressure Changes, nausea, vomiting, reactions to medications-both toxic and allergic and post Partum back pain. All questions were answered. Patient expressed understanding and wished to proceed. Sterile technique was used throughout procedure. Epidural site was Dressed with sterile barrier dressing. No paresthesias, signs of intravascular injection Or signs of intrathecal spread were encountered.  Patient was more comfortable after the epidural was dosed. Please see RN's note for documentation of vital signs and FHR which are stable.

## 2017-10-12 NOTE — Progress Notes (Signed)
  Subjective:  Comfortable with  Epidural Contractions every 2 minutes, lasting 40 seconds MVU 140-160 now on Pitocin 20 mU/min    Objective: BP (!) 165/78   Pulse 85   Temp 97.9 F (36.6 C) (Oral)   Resp 20   Ht 5\' 8"  (1.727 m)   Wt 108.9 kg (240 lb)   LMP 01/15/2017 (Exact Date)   SpO2 100%   BMI 36.49 kg/m  No intake/output data recorded. Total I/O In: -  Out: 850 [Urine:850]   SVE:   Dilation: 5 Effacement (%): 80 Station: -1 Exam by:: Dr. Cletis Media   Unchanged but MVU remain inadequate  Labs: Lab Results  Component Value Date   WBC 12.6 (H) 10/12/2017   HGB 12.5 10/12/2017   HCT 36.5 10/12/2017   MCV 87.5 10/12/2017   PLT 260 10/12/2017    Assessment / Plan: Protracted latent phase  Reviewed findings with patient and husband with recommendation to continue increasing Pitocin and possibly add D5% bolus. Despite ressauring fetal well being and mom being comfortable, Patient is requesting to proceed with primary cesarean section. Surgical risks reviewed again with increased post-op risks of complication with obesity, CHTN and Type 2 diabetes. Patient voices understanding and continues to request to proceed with primary cesarean section.  Jordan Reeves 10/12/2017, 4:47 PM

## 2017-10-12 NOTE — Progress Notes (Signed)
Subjective: Up to bathroom with foley bulb falling out.  Objective: BP 137/62   Pulse 76   Temp 98.9 F (37.2 C) (Oral)   Resp 18   Ht 5\' 8"  (1.727 m)   Wt 108.9 kg (240 lb)   LMP 01/15/2017 (Exact Date)   BMI 36.49 kg/m  No intake/output data recorded. No intake/output data recorded.  FHT: Category 1 UC:   regular, every 4 minutes SVE:   4/70/-2 Assessment:  Induction of labor due to AMA, Type 2 diabetes, and Chronic HTN Cat 1 strip   Plan: Pitocin augmentation Monitor BS q 2 hours  Starla Link CNM, MSN 10/12/2017, 12:44 AM

## 2017-10-12 NOTE — Transfer of Care (Signed)
Immediate Anesthesia Transfer of Care Note  Patient: [name]  Procedure(s) Performed: CESAREAN SECTION WITH BILATERAL TUBAL LIGATION (N/A )  Patient Location: PACU  Anesthesia Type:Epidural  Level of Consciousness: awake, alert  and oriented  Airway & Oxygen Therapy: Patient Spontanous Breathing  Post-op Assessment: Report given to RN and Post -op Vital signs reviewed and stable  Post vital signs: Reviewed and stable  Last Vitals:  Vitals:   10/12/17 1630 10/12/17 1650  BP: (!) 165/78 138/60  Pulse: 85 69  Resp:  20  Temp:    SpO2:      Last Pain:  Vitals:   10/12/17 1600  TempSrc: Oral  PainSc: 0-No pain         Complications: No apparent anesthesia complications

## 2017-10-12 NOTE — Progress Notes (Signed)
Breeonna Mone is a 47 y.o. G5P0040 at 34w4dadmitted for induction of labor due to [place] [facility], Type 2 diabetes, AMA.  Subjective:  Comfortable with  Labor support without medications MVU at 220 Called in the room for variable decelerations not responding to position change    Objective: BP (!) 149/65   Pulse 74   Temp 98.3 F (36.8 C) (Oral)   Resp 20   Ht 5\' 8"  (1.727 m)   Wt 108.9 kg (240 lb)   LMP 01/15/2017 (Exact Date)   BMI 36.49 kg/m  No intake/output data recorded. No intake/output data recorded.  FHT:  FHR: 130 bpm, variability: moderate,  accelerations:  Abscent,  decelerations:  Present early-variable decelerations down to 70 bpm, non-repetitve MVU: 220 SVE:   Dilation: 5 Effacement (%): 70 Station: -2 Exam by:: Dr. Cletis Media  Labs: Lab Results  Component Value Date   WBC 12.6 (H) 10/12/2017   HGB 12.5 10/12/2017   HCT 36.5 10/12/2017   MCV 87.5 10/12/2017   PLT 260 10/12/2017    Assessment / Plan: Category 2 tracing reviewed with patient and husband Pitocin discontinued and amnioinfusion started: tracing returned to Category 1 rapidly  Will restart Pitocin SVD anticipated  Patient again stating she is not opposed to a primary cesarean section with bilateral salpyngectomy. Patient agreeable att this time to continue with IOL   Cesarean section reviewed with pt with R&B including but not limited to:  bleeding, infection, injury to other organs. Low transverse approach planned which will allow vaginal delivery with future pregnancies. Should a vertical incision or inverted T be needed, patient is aware that repeat cesarean sections would be recommended in the future. Expected hospital stay and recovery also discussed.  Katharine Look A Melat Wrisley 10/12/2017, 10:28 AM

## 2017-10-12 NOTE — Progress Notes (Signed)
Subjective: Feeling some contractions but still tolerating them.  Objective: BP 137/62   Pulse 76   Temp 98.9 F (37.2 C) (Oral)   Resp 18   Ht 5\' 8"  (1.727 m)   Wt 108.9 kg (240 lb)   LMP 01/15/2017 (Exact Date)   BMI 36.49 kg/m  No intake/output data recorded. No intake/output data recorded.  FHT: Category 1 UC:   regular, every 1-4 minutes SVE:   Dilation: 5 Effacement (%): 80 Station: -2 Exam by:: [place] [employer], RN  Pitocin at 4 mu   Assessment:  Induction of labor due to [place], Type 2 diabetes, and Chronic HTN Cat 1 strip  Plan: Monitor progress Anticipate SVD  Starla Link CNM, MSN 10/12/2017, 12:46 AM

## 2017-10-13 ENCOUNTER — Encounter (HOSPITAL_COMMUNITY): Payer: Self-pay | Admitting: Obstetrics and Gynecology

## 2017-10-13 LAB — COMPREHENSIVE METABOLIC PANEL
ALBUMIN: 2.5 g/dL — AB (ref 3.5–5.0)
ALT: 16 U/L (ref 14–54)
ANION GAP: 7 (ref 5–15)
AST: 25 U/L (ref 15–41)
Alkaline Phosphatase: 95 U/L (ref 38–126)
BILIRUBIN TOTAL: 0.5 mg/dL (ref 0.3–1.2)
BUN: 11 mg/dL (ref 6–20)
CHLORIDE: 104 mmol/L (ref 101–111)
CO2: 21 mmol/L — AB (ref 22–32)
Calcium: 8 mg/dL — ABNORMAL LOW (ref 8.9–10.3)
Creatinine, Ser: 0.62 mg/dL (ref 0.44–1.00)
GFR calc Af Amer: >60 mL/min (ref >60)
GFR calc non Af Amer: >60 mL/min (ref >60)
GLUCOSE: 90 mg/dL (ref 65–99)
POTASSIUM: 4 mmol/L (ref 3.5–5.1)
SODIUM: 132 mmol/L — AB (ref 135–145)
Total Protein: 5.3 g/dL — ABNORMAL LOW (ref 6.5–8.1)

## 2017-10-13 LAB — CBC
HEMATOCRIT: 30.3 % — AB (ref 36.0–46.0)
Hemoglobin: 10.6 g/dL — ABNORMAL LOW (ref 12.0–15.0)
MCH: 30.3 pg (ref 26.0–34.0)
MCHC: 35 g/dL (ref 30.0–36.0)
MCV: 86.6 fL (ref 78.0–100.0)
PLATELETS: 228 10*3/uL (ref 150–400)
RBC: 3.5 MIL/uL — ABNORMAL LOW (ref 3.87–5.11)
RDW: 14.2 % (ref 11.5–15.5)
WBC: 18 10*3/uL — AB (ref 4.0–10.5)

## 2017-10-13 LAB — GLUCOSE, CAPILLARY
GLUCOSE-CAPILLARY: 155 mg/dL — AB (ref 65–99)
Glucose-Capillary: 86 mg/dL (ref 65–99)
Glucose-Capillary: 78 mg/dL (ref 65–99)
Glucose-Capillary: 77 mg/dL (ref 65–99)

## 2017-10-13 NOTE — Lactation Note (Signed)
This note was copied from a baby's chart. Lactation Consultation Note  Patient Name: Jordan Reeves HFSFS'E Date: 10/13/2017 Reason for consult: Initial assessment;Primapara;1st time breastfeeding;Early term 37-38.6wks  23 hours early term female who is being partially BF and formula fed by his mother; she's a P1. Mom chose to BF formula fed at admission; baby was already supplemented with formula yesterday due to low glucose, but mom has only been BF today, she says baby is doing well.  Baby was asleep when entering the room, per mom baby has been having long feedings 40-80 min and she's very pleased with BF, she states that feeding at the breast are comfortable without any pain or discomfort, nipples look intact upon examination.  Taught mom how to hand express, she'll feed that fresh expressed breastmilk to baby ASAP. Encouraged her to feed baby on feeding cues and put him STS during feedings. Reviewed BF brochure, BF resources and BF diary, mom is aware of [place] services and will call PRN.  Maternal Data Formula Feeding for Exclusion: Yes Reason for exclusion: Mother's choice to formula and breast feed on admission Has patient been taught Hand Expression?: Yes Does the patient have breastfeeding experience prior to this delivery?: No  Feeding Feeding Type: Breast Fed Length of feed: 40 min  Interventions Interventions: Breast feeding basics reviewed;Breast massage;Breast compression;Hand express;Expressed milk  Lactation Tools Discussed/Used WIC Program: No   Consult Status Consult Status: Follow-up Date: 10/14/17 Follow-up type: In-patient    [name] Francene Boyers 10/13/2017, 5:00 PM

## 2017-10-13 NOTE — Progress Notes (Signed)
Pt refused Lovenox injection. Plan of care to increase ambulation today, stable gait and ambulation. Foley catheter d/c @ 0745. Patient progressing well. Will continue to monitor. Jackson Latino, RN

## 2017-10-13 NOTE — Progress Notes (Signed)
Notified Irene Shipper, the nurse midwife on call, about the recent blood glucose of 155. States no new orders at this time and will reevaluate insulin dose in the morning.

## 2017-10-13 NOTE — Progress Notes (Signed)
Jordan Reeves [id number] Postpartum Day 1 S/P Primary Cesarean Section due to Patient Desire  Subjective: Patient up ad lib, denies syncope or dizziness. Reports consuming regular diet without issues and denies N/V. Patient reports no bowel movement and is passing flatus.  Denies issues with urination and reports bleeding is "good."  Patient is breastfeeding and reports going well.  BTL for postpartum contraception completed.  Pain is being appropriately managed with use of motrin and tylenol.   Objective: Temp:  [97.6 F (36.4 C)-100 F (37.8 C)] 99.7 F (37.6 C) (02/19 0316) Pulse Rate:  [30-85] 69 (02/19 0316) Resp:  [14-20] 18 (02/19 0316) BP: (106-165)/(51-83) 118/52 (02/19 0316) SpO2:  [95 %-100 %] 96 % (02/19 0551)  Recent Labs    10/12/17 0906 10/12/17 1935 10/13/17 0529  HGB 12.5 11.5* 10.6*  HCT 36.5 33.5* 30.3*  WBC 12.6* 16.3* 18.0*    Physical Exam:  General: alert, cooperative and no distress Mood/Affect: Appropriate/Bright Lungs: clear to auscultation, no wheezes, rales or rhonchi, symmetric air entry.  Heart: normal rate and regular rhythm. Breast: breasts appear normal, no suspicious masses, no skin or nipple changes or axillary nodes. Abdomen:  + bowel sounds, Soft, NT Incision: no significant drainage, Honeycomb dressing  Uterine Fundus: firm +1/U Lochia: appropriate Skin: Warm, Dry. DVT Evaluation: No evidence of DVT seen on physical exam. JP drain:   None  Assessment Post Operative Day 1 S/P Primary C/S Normal Involution BreastFeeding Hemodynamically Stable Female Child  Plan: -Remove foley catheter -Allow ambulation as tolerated -Discussed pain management -No circumcision -Continue other mgmt as ordered -Dr. Carmon Sails to be updated on patient status  Jordan Conners MSN, CNM 10/13/2017, 5:57 AM

## 2017-10-14 LAB — GLUCOSE, CAPILLARY
GLUCOSE-CAPILLARY: 87 mg/dL (ref 65–99)
Glucose-Capillary: 99 mg/dL (ref 65–99)

## 2017-10-14 MED ORDER — LABETALOL HCL 300 MG PO TABS: 300.0000 mg | ORAL_TABLET | Freq: Three times a day (TID) | ORAL | 4 refills | Status: DC

## 2017-10-14 MED ORDER — IBUPROFEN 600 MG PO TABS: 600.0000 mg | ORAL_TABLET | Freq: Four times a day (QID) | ORAL | 0 refills | Status: DC

## 2017-10-14 MED ORDER — OXYCODONE-ACETAMINOPHEN 5-325 MG PO TABS: 1.0000 | ORAL_TABLET | ORAL | 0 refills | Status: DC | PRN

## 2017-10-14 MED ORDER — FERROUS SULFATE 325 (65 FE) MG PO TABS: 325.0000 mg | ORAL_TABLET | Freq: Two times a day (BID) | ORAL | 3 refills | Status: DC

## 2017-10-14 NOTE — Discharge Summary (Signed)
OB Discharge Summary     Patient Name: Jordan Reeves DOB: [birth year] MRN: [id number]  Date of admission: 10/11/2017 Delivering MD: Delsa Bern   Date of discharge: 10/14/2017  Admitting diagnosis: 38 wk induction Intrauterine pregnancy: [redacted]w[redacted]d     Secondary diagnosis:  Active Problems:   Chronic hypertension complicating or reason for care during pregnancy, third trimester  Additional problems: None     Discharge diagnosis: Term Pregnancy Delivered, CHTN and Type 2 DM                                                                                                Post partum procedures:None  Augmentation: AROM, Pitocin and Foley Balloon  Complications: None  Hospital course:  Induction of Labor With Cesarean Section  47 y.o. yo [id number] at [redacted]w[redacted]d was admitted to the hospital 10/11/2017 for induction of labor. Patient had a labor course significant for failure to progress. The patient went for cesarean section due to Arrest of Dilation, and delivered a Viable infant,10/12/2017  Membrane Rupture Time/Date: 6:45 AM ,10/12/2017   Details of operation can be found in separate operative Note.  Patient had an uncomplicated postpartum course. She is ambulating, tolerating a regular diet, passing flatus, and urinating well.  Patient is discharged home in stable condition on 10/14/17.                                    Physical exam  Vitals:   10/13/17 2320 10/13/17 2335 10/14/17 0608 10/14/17 0754  BP: 118/60 (!) 113/47 131/65 131/65  Pulse: 68 66 74 74  Resp:  18 18   Temp:  98.1 F (36.7 C) 98.2 F (36.8 C)   TempSrc:  Oral Oral   SpO2:      Weight:      Height:       General: alert, cooperative and no distress Lochia: appropriate Uterine Fundus: firm Incision: Dressing is clean, dry, and intact DVT Evaluation: No evidence of DVT seen on physical exam. Negative Homan's sign. Labs: Lab Results  Component Value Date   WBC 18.0 (H) 10/13/2017   HGB 10.6 (L) 10/13/2017   HCT 30.3  (L) 10/13/2017   MCV 86.6 10/13/2017   PLT 228 10/13/2017   CMP Latest Ref Rng & Units 10/13/2017  Glucose 65 - 99 mg/dL 90  BUN 6 - 20 mg/dL 11  Creatinine 0.44 - 1.00 mg/dL 0.62  Sodium 135 - 145 mmol/L 132(L)  Potassium 3.5 - 5.1 mmol/L 4.0  Chloride 101 - 111 mmol/L 104  CO2 22 - 32 mmol/L 21(L)  Calcium 8.9 - 10.3 mg/dL 8.0(L)  Total Protein 6.5 - 8.1 g/dL 5.3(L)  Total Bilirubin 0.3 - 1.2 mg/dL 0.5  Alkaline Phos 38 - 126 U/L 95  AST 15 - 41 U/L 25  ALT 14 - 54 U/L 16    Discharge instruction: per After Visit Summary and "Baby and Me Booklet".  After visit meds:  Allergies as of 10/14/2017   No Known Allergies     Medication List  TAKE these medications   ferrous sulfate 325 (65 FE) MG tablet Take 1 tablet (325 mg total) by mouth 2 (two) times daily with a meal.   ibuprofen 600 MG tablet Commonly known as:  ADVIL,MOTRIN Take 1 tablet (600 mg total) by mouth every 6 (six) hours.   insulin NPH Human 100 UNIT/ML injection Commonly known as:  HUMULIN N,NOVOLIN N Inject 20 Units into the skin 2 (two) times daily before a meal.   labetalol 200 MG tablet Commonly known as:  NORMODYNE Take 2 tablets (400 mg total) by mouth 3 (three) times daily. What changed:  how much to take   labetalol 300 MG tablet Commonly known as:  NORMODYNE Take 1 tablet (300 mg total) by mouth 3 (three) times daily. What changed:  You were already taking a medication with the same name, and this prescription was added. Make sure you understand how and when to take each.   NIFEdipine 60 MG 24 hr tablet Commonly known as:  PROCARDIA XL/ADALAT-CC Take 60 mg by mouth at bedtime.   oxyCODONE-acetaminophen 5-325 MG tablet Commonly known as:  PERCOCET/ROXICET Take 1 tablet by mouth every 4 (four) hours as needed (pain scale 4-7).   SYNTHROID 100 MCG tablet Generic drug:  levothyroxine Take 100 mg by mouth daily.       Diet: carb modified diet  Activity: Advance as tolerated. Pelvic  rest for 6 weeks.   Outpatient follow up:6 weeks Follow up Appt:No future appointments. Follow up Visit:No Follow-up on file.  Postpartum contraception: Tubal Ligation  Newborn Data: Live born female  Birth Weight: 6 lb 11.4 oz (3045 g) APGAR: 41, 9  Newborn Delivery   Birth date/time:  10/12/2017 17:36:00 Delivery type:  C-Section, Low Transverse C-section categorization:  Primary     Baby Feeding: Breast Disposition:home with mother   10/14/2017 Starla Link, CNM

## 2017-10-14 NOTE — Lactation Note (Addendum)
This note was copied from a baby's chart. Lactation Consultation Note  Patient Name: Jordan Reeves [id number] Date: 10/14/2017   Infant latched w/ease, but there were no signs of milk transfer. Infant sleepy, requiring stimulation, but no improvement noted.  Mom & Dad were educated on signs of swallows. Mom's breasts are very soft; she has no interest in pumping & has no interest in  breast feeding if it isn't going well (she related that a family member's infant became skinny b/c his or her mother was trying too hard to breastfeed). Mom felt very strongly about not allowing a baby to lose too much weight.   Plan: Mom to offer breast, but to remove infant once swallows are no longer detected & then offer a bottle.   Dr. Doneen Poisson was updated. Dr. Doneen Poisson would like "Wenda Overland" to be reweighed. Jana Half, RN made aware. Jana Half to [employer] baby after next bottle feeding.   Matthias Hughs [facility] 10/14/2017, 10:05 AM

## 2017-10-21 ENCOUNTER — Encounter (HOSPITAL_COMMUNITY): Payer: Self-pay

## 2017-12-29 ENCOUNTER — Emergency Department (HOSPITAL_COMMUNITY)
Admission: EM | Admit: 2017-12-29 | Discharge: 2017-12-29 | Disposition: A | Discharge status: Home / Self Care | Payer: BLUE CROSS/BLUE SHIELD | Attending: Emergency Medicine | Admitting: Emergency Medicine

## 2017-12-29 ENCOUNTER — Encounter (HOSPITAL_COMMUNITY): Payer: Self-pay | Admitting: Emergency Medicine

## 2017-12-29 ENCOUNTER — Telehealth: Payer: Self-pay | Admitting: Cardiology

## 2017-12-29 ENCOUNTER — Emergency Department (HOSPITAL_COMMUNITY): Payer: BLUE CROSS/BLUE SHIELD

## 2017-12-29 ENCOUNTER — Other Ambulatory Visit: Payer: Self-pay

## 2017-12-29 DIAGNOSIS — E039 Hypothyroidism, unspecified: Secondary | ICD-10-CM | POA: Diagnosis not present

## 2017-12-29 DIAGNOSIS — Z79899 Other long term (current) drug therapy: Secondary | ICD-10-CM | POA: Insufficient documentation

## 2017-12-29 DIAGNOSIS — E119 Type 2 diabetes mellitus without complications: Secondary | ICD-10-CM | POA: Insufficient documentation

## 2017-12-29 DIAGNOSIS — R0602 Shortness of breath: Secondary | ICD-10-CM | POA: Diagnosis not present

## 2017-12-29 DIAGNOSIS — I10 Essential (primary) hypertension: Secondary | ICD-10-CM | POA: Diagnosis not present

## 2017-12-29 DIAGNOSIS — R0789 Other chest pain: Secondary | ICD-10-CM | POA: Diagnosis present

## 2017-12-29 DIAGNOSIS — R079 Chest pain, unspecified: Secondary | ICD-10-CM

## 2017-12-29 LAB — COMPREHENSIVE METABOLIC PANEL
ALK PHOS: 62 U/L (ref 38–126)
ALT: 20 U/L (ref 14–54)
AST: 18 U/L (ref 15–41)
Albumin: 4.2 g/dL (ref 3.5–5.0)
Anion gap: 8 (ref 5–15)
BILIRUBIN TOTAL: 0.9 mg/dL (ref 0.3–1.2)
BUN: 11 mg/dL (ref 6–20)
CALCIUM: 9.4 mg/dL (ref 8.9–10.3)
CO2: 27 mmol/L (ref 22–32)
Chloride: 105 mmol/L (ref 101–111)
Creatinine, Ser: 0.7 mg/dL (ref 0.44–1.00)
Glucose, Bld: 132 mg/dL — ABNORMAL HIGH (ref 65–99)
Potassium: 4 mmol/L (ref 3.5–5.1)
SODIUM: 140 mmol/L (ref 135–145)
TOTAL PROTEIN: 7.6 g/dL (ref 6.5–8.1)

## 2017-12-29 LAB — TROPONIN I

## 2017-12-29 LAB — CBC WITH DIFFERENTIAL/PLATELET
Basophils Absolute: 0 10*3/uL (ref 0.0–0.1)
Basophils Relative: 0 %
EOS ABS: 0.2 10*3/uL (ref 0.0–0.7)
Eosinophils Relative: 2 %
HEMATOCRIT: 39.9 % (ref 36.0–46.0)
HEMOGLOBIN: 13.6 g/dL (ref 12.0–15.0)
LYMPHS ABS: 1.9 10*3/uL (ref 0.7–4.0)
Lymphocytes Relative: 23 %
MCH: 29.4 pg (ref 26.0–34.0)
MCHC: 34.1 g/dL (ref 30.0–36.0)
MCV: 86.4 fL (ref 78.0–100.0)
MONOS PCT: 7 %
Monocytes Absolute: 0.6 10*3/uL (ref 0.1–1.0)
NEUTROS PCT: 68 %
Neutro Abs: 5.9 10*3/uL (ref 1.7–7.7)
Platelets: 279 10*3/uL (ref 150–400)
RBC: 4.62 MIL/uL (ref 3.87–5.11)
RDW: 13.7 % (ref 11.5–15.5)
WBC: 8.6 10*3/uL (ref 4.0–10.5)

## 2017-12-29 LAB — D-DIMER, QUANTITATIVE (NOT AT ARMC)

## 2017-12-29 LAB — TSH: TSH: 1.052 u[IU]/mL (ref 0.350–4.500)

## 2017-12-29 LAB — BRAIN NATRIURETIC PEPTIDE: B NATRIURETIC PEPTIDE 5: 48 pg/mL (ref 0.0–100.0)

## 2017-12-29 NOTE — Telephone Encounter (Signed)
Please have her see her PCP this week 

## 2017-12-29 NOTE — ED Provider Notes (Signed)
[facility] EMERGENCY DEPARTMENT Provider Note   CSN: [id number] Arrival date & time: 12/29/17  2130     History   Chief Complaint No chief complaint on file.   HPI Jordan Reeves is a 47 y.o. female.   Chest Pain   This is a new problem. The current episode started more than 2 days ago. The problem occurs daily. The problem has not changed since onset.The pain is present in the substernal region. The pain is moderate. The quality of the pain is described as brief, dull and heavy. The pain radiates to the upper back. The symptoms are aggravated by deep breathing. Associated symptoms include back pain and shortness of breath. Pertinent negatives include no cough, no diaphoresis, no dizziness, no exertional chest pressure and no vomiting. She has tried nothing for the symptoms.    Past Medical History:  Diagnosis Date  . Benign essential HTN 01/25/2015  . Diabetes mellitus without complication ([place])    insuiln currently  . Dysrhythmia    PVCs last EKG NSR 09/2016  . Hypertension   . Hypothyroidism   . Infertility, female   . Obesity (BMI 30-39.9) 02/05/2016  . PCOS (polycystic ovarian syndrome)   . PVCs (premature ventricular contractions) 01/25/2015  . Thyroiditis    Hashimoto's now with hypothyroidism    Patient Active Problem List   Diagnosis Date Noted  . Chronic hypertension complicating or reason for care during pregnancy, third trimester 10/11/2017  . 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 Diabetes mellitus without complication ([place]) 86/57/8469  . AMA (advanced maternal age) multigravida 35+ 05/01/2017  . Tachycardia 05/01/2017  . Obesity (BMI 30-39.9) 02/05/2016  . Benign essential HTN 01/25/2015  . PVCs (premature ventricular contractions) 01/25/2015  . Heart palpitations 11/21/2014  . Thyroiditis     Past Surgical History:  Procedure Laterality Date  . CESAREAN SECTION WITH BILATERAL TUBAL LIGATION N/A 10/12/2017   Procedure: CESAREAN SECTION WITH  BILATERAL TUBAL LIGATION;  Surgeon: Delsa Bern, MD;  Location: [place];  Service: Obstetrics;  Laterality: N/A;  . TONSILLECTOMY AND ADENOIDECTOMY    . WISDOM TOOTH EXTRACTION       OB History    Gravida  5   Para  1   Term  1   Preterm      AB  4   Living  1     SAB  4   TAB      Ectopic      Multiple  0   Live Births  1            Home Medications    Prior to Admission medications   Medication Sig Start Date End Date Taking? Authorizing Provider  labetalol (NORMODYNE) 200 MG tablet Take 2 tablets (400 mg total) by mouth 3 (three) times daily. Patient taking differently: Take 100-200 mg by mouth 3 (three) times daily. 200mg  twice daily and 100mg  at bedtime 04/29/17  Yes Dunn, Dayna N, PA-C  NIFEdipine (PROCARDIA XL/ADALAT-CC) 60 MG 24 hr tablet Take 60 mg by mouth at bedtime.  03/06/17  Yes [provider]  TIROSINT 100 MCG CAPS Take 1 capsule by mouth daily. 12/08/17  Yes [provider]    Family History Family History  Problem Relation Age of Onset  . Heart disease Paternal Grandfather   . Lymphoma Paternal Grandmother   . Heart disease Maternal Grandmother   . Heart disease Maternal Grandfather   . Heart disease Father   . Diabetes Mother   .  Hypertension Mother   . Heart disease Mother        heart murmu  . Hyperlipidemia Mother   . Breast cancer Maternal Aunt   . Cervical cancer Maternal Aunt     Social History Social History   Tobacco Use  . Smoking status: Never Smoker  . Smokeless tobacco: Never Used  Substance Use Topics  . Alcohol use: No    Alcohol/week: 0.0 oz  . Drug use: No     Allergies   Patient has no known allergies.   Review of Systems Review of Systems  Constitutional: Negative for diaphoresis.  Respiratory: Positive for shortness of breath. Negative for cough.   Cardiovascular: Positive for chest pain and leg swelling.  Gastrointestinal: Negative for vomiting.  Musculoskeletal:  Positive for back pain.  Neurological: Negative for dizziness.  All other systems reviewed and are negative.    Physical Exam Updated Vital Signs BP (!) 147/76 (BP Location: Left Arm)   Pulse 87   Temp 98.1 F (36.7 C) (Oral)   Resp 16   LMP 12/23/2017   SpO2 99%   Physical Exam  Constitutional: She is oriented to person, place, and time. She appears well-developed and well-nourished.  HENT:  Head: Normocephalic and atraumatic.  Eyes: Conjunctivae and EOM are normal.  Neck: Normal range of motion.  Cardiovascular: Normal rate and regular rhythm.  Pulmonary/Chest: No stridor. No respiratory distress.  Abdominal: Soft. She exhibits no distension.  Musculoskeletal: Normal range of motion. She exhibits edema (R>L).  Neurological: She is alert and oriented to person, place, and time. No cranial nerve deficit. Coordination normal.  Skin: Skin is warm and dry.  Nursing note and vitals reviewed.    ED Treatments / Results  Labs (all labs ordered are listed, but only abnormal results are displayed) Labs Reviewed  COMPREHENSIVE METABOLIC PANEL - Abnormal; Notable for the following components:      Result Value   Glucose, Bld 132 (*)    All other components within normal limits  CBC WITH DIFFERENTIAL/PLATELET  TROPONIN I  D-DIMER, QUANTITATIVE (NOT AT [facility])  BRAIN NATRIURETIC PEPTIDE  TSH  T4, FREE  T3    EKG EKG Interpretation  Date/Time:  Tuesday Dec 29 2017 19:05:56 EDT Ventricular Rate:  78 PR Interval:    QRS Duration: 94 QT Interval:  396 QTC Calculation: 452 R Axis:   64 Text Interpretation:  Sinus rhythm Low voltage, precordial leads Baseline wander in lead(s) I II V3 No significant change since last tracing Confirmed by Merrily Pew [phone]) on 12/29/2017 9:11:33 PM   Radiology Dg Chest 2 View  Result Date: 12/29/2017 CLINICAL DATA:  Chest pain. EXAM: CHEST - 2 VIEW COMPARISON:  Radiographs of December 03, 2014. FINDINGS: The heart size and mediastinal  contours are within normal limits. Both lungs are clear. No pneumothorax or pleural effusion is noted. The visualized skeletal structures are unremarkable. IMPRESSION: No active cardiopulmonary disease. Electronically Signed   By: Marijo Conception, M.D.   On: 12/29/2017 20:21    Procedures Procedures (including critical care time)  Medications Ordered in ED Medications - No data to display   Initial Impression / Assessment and Plan / ED Course  I have reviewed the triage vital signs and the nursing notes.  Pertinent labs & imaging results that were available during my care of the patient were reviewed by me and considered in my medical decision making (see chart for details).     eval for thyroid abnormalities, PE/DVT, ACS.  Unclear etiology for symptoms. Workup negative. She feels it is similar to episodes she has had in the past that was medication related. Maybe? Stable for discharge at this point to continue follow up with PCP.   Final Clinical Impressions(s) / ED Diagnoses   Final diagnoses:  Nonspecific chest pain    ED Discharge Orders    None       Iden Stripling, Corene Cornea, MD 12/29/17 2221

## 2017-12-29 NOTE — ED Triage Notes (Signed)
Recent pregnancy,  Chest pain intermittent x 2 weeks, edema bilateral legs. Today pain in arms, called pcp was told to come to ED, pt has hx of same symptoms and on thyroid medication

## 2017-12-29 NOTE — Telephone Encounter (Signed)
New Message   Pt c/o swelling: STAT is pt has developed SOB within 24 hours  1) How much weight have you gained and in what time span? 10lbs in about two weeks   2) If swelling, where is the swelling located? Feet   3) Are you currently taking a fluid pill? No   4) Are you currently SOB? No   5) Do you have a log of your daily weights (if so, list)? No   6) Have you gained 3 pounds in a day or 5 pounds in a week?  10lbs in two weeks   7) Have you traveled recently? No      Pt c/o of Chest Pain: STAT if CP now or developed within 24 hours  1. Are you having CP right now? no  2. Are you experiencing any other symptoms (ex. SOB, nausea, vomiting, sweating)? none  3. How long have you been experiencing CP?  Patient described it more as a ache in the shoulder area verses a pain for about 3-4 days   4. Is your CP continuous or coming and going? Comes and go  5. Have you taken Nitroglycerin? no ?

## 2017-12-29 NOTE — Telephone Encounter (Signed)
I returned call to patient. Patient c/o of LE swelling and intermittent chest pain that radiates to the middle of her shoulder blades and alternates arms. She states she feels the chest pain around the time her labetalol is due. She is on labetalol 300 mg TID. She states the symptoms are relieved after she takes the labetalol.  Patient had a C-section 2/19, c/o LE swelling since she her delivery. Patient stated she called her PCP today and he informed her to go the the ER. She also stated the chest pain has been ongoing for the past 2 weeks since she was started on a new thyroid medication by her endocrinologist. I encouraged patient to go the ED to be evaluated per her PCP. Patient is reluctant to go because she states she already know what they are going to say and she would rather have an appt. Patient denies chest pain, palpitations, dizziness or syncope. I scheduled patient with Estella Husk, PA on 02/02/18 and informed patient I would send to Dr. Radford Pax for additional recommendations. She verbalized understanding and thankful for the call.

## 2017-12-30 LAB — T4, FREE: Free T4: 1.01 ng/dL (ref 0.82–1.77)

## 2017-12-30 NOTE — Telephone Encounter (Signed)
Informed patient to follow up with pcp. Patient states she went to the ED yesterday and patient will follow up with primary MD. Patient verbalized understanding and thankful for the call

## 2017-12-31 LAB — T3: T3, Total: 115 ng/dL (ref 71–180)

## 2018-01-11 ENCOUNTER — Encounter: Payer: Self-pay | Admitting: Physician Assistant

## 2018-02-02 ENCOUNTER — Ambulatory Visit: Payer: BLUE CROSS/BLUE SHIELD | Admitting: Physician Assistant

## 2018-04-11 ENCOUNTER — Emergency Department (HOSPITAL_COMMUNITY): Payer: BLUE CROSS/BLUE SHIELD

## 2018-04-11 ENCOUNTER — Emergency Department (HOSPITAL_COMMUNITY)
Admission: EM | Admit: 2018-04-11 | Discharge: 2018-04-11 | Disposition: A | Discharge status: Home / Self Care | Payer: BLUE CROSS/BLUE SHIELD | Attending: Emergency Medicine | Admitting: Emergency Medicine

## 2018-04-11 ENCOUNTER — Other Ambulatory Visit: Payer: Self-pay

## 2018-04-11 DIAGNOSIS — E119 Type 2 diabetes mellitus without complications: Secondary | ICD-10-CM | POA: Insufficient documentation

## 2018-04-11 DIAGNOSIS — I10 Essential (primary) hypertension: Secondary | ICD-10-CM | POA: Insufficient documentation

## 2018-04-11 DIAGNOSIS — L03031 Cellulitis of right toe: Secondary | ICD-10-CM | POA: Diagnosis not present

## 2018-04-11 DIAGNOSIS — Z79899 Other long term (current) drug therapy: Secondary | ICD-10-CM | POA: Diagnosis not present

## 2018-04-11 DIAGNOSIS — E039 Hypothyroidism, unspecified: Secondary | ICD-10-CM | POA: Insufficient documentation

## 2018-04-11 DIAGNOSIS — M79671 Pain in right foot: Secondary | ICD-10-CM | POA: Diagnosis present

## 2018-04-11 DIAGNOSIS — Z794 Long term (current) use of insulin: Secondary | ICD-10-CM | POA: Diagnosis not present

## 2018-04-11 LAB — COMPREHENSIVE METABOLIC PANEL
ALT: 14 U/L (ref 0–44)
AST: 14 U/L — AB (ref 15–41)
Albumin: 4.2 g/dL (ref 3.5–5.0)
Alkaline Phosphatase: 58 U/L (ref 38–126)
Anion gap: 7 (ref 5–15)
BILIRUBIN TOTAL: 0.5 mg/dL (ref 0.3–1.2)
BUN: 17 mg/dL (ref 6–20)
CHLORIDE: 106 mmol/L (ref 98–111)
CO2: 27 mmol/L (ref 22–32)
Calcium: 9.3 mg/dL (ref 8.9–10.3)
Creatinine, Ser: 1.03 mg/dL — ABNORMAL HIGH (ref 0.44–1.00)
Glucose, Bld: 130 mg/dL — ABNORMAL HIGH (ref 70–99)
Potassium: 4 mmol/L (ref 3.5–5.1)
Sodium: 140 mmol/L (ref 135–145)
Total Protein: 7.9 g/dL (ref 6.5–8.1)

## 2018-04-11 LAB — CBC WITH DIFFERENTIAL/PLATELET
Basophils Absolute: 0 10*3/uL (ref 0.0–0.1)
Basophils Relative: 0 %
Eosinophils Absolute: 0.2 10*3/uL (ref 0.0–0.7)
Eosinophils Relative: 2 %
HEMATOCRIT: 39.1 % (ref 36.0–46.0)
Hemoglobin: 13.3 g/dL (ref 12.0–15.0)
LYMPHS ABS: 2.1 10*3/uL (ref 0.7–4.0)
LYMPHS PCT: 18 %
MCH: 29.9 pg (ref 26.0–34.0)
MCHC: 34 g/dL (ref 30.0–36.0)
MCV: 87.9 fL (ref 78.0–100.0)
MONO ABS: 1 10*3/uL (ref 0.1–1.0)
MONOS PCT: 8 %
Neutro Abs: 8.7 10*3/uL — ABNORMAL HIGH (ref 1.7–7.7)
Neutrophils Relative %: 72 %
PLATELETS: 286 10*3/uL (ref 150–400)
RBC: 4.45 MIL/uL (ref 3.87–5.11)
RDW: 13.4 % (ref 11.5–15.5)
WBC: 12 10*3/uL — ABNORMAL HIGH (ref 4.0–10.5)

## 2018-04-11 LAB — I-STAT CG4 LACTIC ACID, ED: Lactic Acid, Venous: 1.7 mmol/L (ref 0.5–1.9)

## 2018-04-11 LAB — SEDIMENTATION RATE: SED RATE: 20 mm/h (ref 0–22)

## 2018-04-11 LAB — TSH: TSH: 3.238 u[IU]/mL (ref 0.350–4.500)

## 2018-04-11 MED ORDER — AMOXICILLIN-POT CLAVULANATE 875-125 MG PO TABS
1.0000 | ORAL_TABLET | Freq: Once | ORAL | Status: AC
  Administered 2018-04-11: 1 via ORAL
  Filled 2018-04-11: qty 1

## 2018-04-11 MED ORDER — AMOXICILLIN-POT CLAVULANATE 875-125 MG PO TABS: 1.0000 | ORAL_TABLET | Freq: Two times a day (BID) | ORAL | 0 refills | Status: DC

## 2018-04-11 MED ORDER — DOXYCYCLINE HYCLATE 100 MG PO CAPS: 100.0000 mg | ORAL_CAPSULE | Freq: Two times a day (BID) | ORAL | 0 refills | Status: DC

## 2018-04-11 MED ORDER — DOXYCYCLINE HYCLATE 100 MG PO TABS
100.0000 mg | ORAL_TABLET | Freq: Once | ORAL | Status: AC
  Administered 2018-04-11: 100 mg via ORAL
  Filled 2018-04-11: qty 1

## 2018-04-11 NOTE — ED Triage Notes (Signed)
Patient stated whole foot pop 3 days ago, sore third digit and noted swelling and redness. R toe bruising

## 2018-04-11 NOTE — Discharge Instructions (Addendum)
You declined admission for antibiotics today.Take the antibiotics as prescribed.  Keep the foot elevated when you are resting.  Follow-up with the podiatrist or orthopedic doctor for recheck in 2 days.  If you are not able to see them, you should return to the ED for recheck.  Return sooner with spreading redness, fever, worsening pain or any other concerns.

## 2018-04-11 NOTE — ED Provider Notes (Signed)
[facility] Provider Note   CSN: [id number] Arrival date & time: 04/11/18  0021     History   Chief Complaint Chief Complaint  Patient presents with  . Foot Pain    HPI Jordan Reeves is a 47 y.o. female.  Patient with history of peripheral neuropathy but only "prediabetic".  Not on insulin or oral hypoglycemics.  Complains of 2 weeks of right foot pain and swelling.  Denies trauma but felt a pop in her foot several days ago.  She is noticed redness and swelling to her third toe over the past 3 days but denies pain.  She states there is a "split" in the skin tonight that concerned her and made her come to the hospital.  Denies any bleeding or drainage.  Denies any fever, chills, nausea or vomiting. Denies any swelling in her calf or thigh.  No chest pain or shortness of breath. She saw a PA 2 weeks ago and was told there is nothing wrong with her foot.  The history is provided by the patient.  Foot Pain  Pertinent negatives include no chest pain, no abdominal pain, no headaches and no shortness of breath.    Past Medical History:  Diagnosis Date  . Benign essential HTN 01/25/2015  . Diabetes mellitus without complication (HCC)    insuiln currently  . Dysrhythmia    PVCs last EKG NSR 09/2016  . Hypertension   . Hypothyroidism   . Infertility, female   . Obesity (BMI 30-39.9) 02/05/2016  . PCOS (polycystic ovarian syndrome)   . PVCs (premature ventricular contractions) 01/25/2015  . Thyroiditis    Hashimoto's now with hypothyroidism    Patient Active Problem List   Diagnosis Date Noted  . Chronic hypertension complicating or reason for care during pregnancy, third trimester 10/11/2017  . 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 Diabetes mellitus without complication (HCC) 05/01/2017  . AMA (advanced maternal age) multigravida 35+ 05/01/2017  . Tachycardia 05/01/2017  . Obesity (BMI 30-39.9) 02/05/2016  . Benign essential HTN 01/25/2015  .  PVCs (premature ventricular contractions) 01/25/2015  . Heart palpitations 11/21/2014  . Thyroiditis     Past Surgical History:  Procedure Laterality Date  . CESAREAN SECTION WITH BILATERAL TUBAL LIGATION N/A 10/12/2017   Procedure: CESAREAN SECTION WITH BILATERAL TUBAL LIGATION;  Surgeon: Rivard, Sandra, MD;  Location: [facility];  Service: Obstetrics;  Laterality: N/A;  . TONSILLECTOMY AND ADENOIDECTOMY    . WISDOM TOOTH EXTRACTION       OB History    Gravida  5   Para  1   Term  1   Preterm      AB  4   Living  1     SAB  4   TAB      Ectopic      Multiple  0   Live Births  1            Home Medications    Prior to Admission medications   Medication Sig Start Date End Date Taking? Authorizing Provider  labetalol (NORMODYNE) 200 MG tablet Take 2 tablets (400 mg total) by mouth 3 (three) times daily. Patient taking differently: Take 100-200 mg by mouth 3 (three) times daily. 200mg twice daily and 100mg at bedtime 04/29/17   Dunn, Dayna N, PA-C  NIFEdipine (PROCARDIA XL/ADALAT-CC) 60 MG 24 hr tablet Take 60 mg by mouth at bedtime.  03/06/17   [provider]  TIROSINT 100 MCG CAPS Take 1   capsule by mouth daily. 12/08/17   [provider]    Family History Family History  Problem Relation Age of Onset  . Heart disease Paternal Grandfather   . Lymphoma Paternal Grandmother   . Heart disease Maternal Grandmother   . Heart disease Maternal Grandfather   . Heart disease Father   . Diabetes Mother   . Hypertension Mother   . Heart disease Mother        heart murmu  . Hyperlipidemia Mother   . Breast cancer Maternal Aunt   . Cervical cancer Maternal Aunt     Social History Social History   Tobacco Use  . Smoking status: Never Smoker  . Smokeless tobacco: Never Used  Substance Use Topics  . Alcohol use: No    Alcohol/week: 0.0 standard drinks  . Drug use: No     Allergies   Patient has no known allergies.   Review of  Systems Review of Systems  Constitutional: Negative for activity change, appetite change and fever.  HENT: Negative for congestion and rhinorrhea.   Eyes: Negative for visual disturbance.  Respiratory: Negative for cough, chest tightness and shortness of breath.   Cardiovascular: Negative for chest pain.  Gastrointestinal: Negative for abdominal pain, nausea and vomiting.  Genitourinary: Negative for dysuria and hematuria.  Musculoskeletal: Positive for arthralgias and myalgias.  Skin: Positive for wound.  Neurological: Negative for dizziness, weakness and headaches.     all other systems are negative except as noted in the HPI and PMH.   Physical Exam Updated Vital Signs BP (!) 147/76   Pulse 79   Temp 98.6 F (37 C) (Oral)   Resp 18   Ht 5' 8" (1.727 m)   Wt 111.6 kg   LMP 03/19/2018 (Exact Date)   SpO2 97%   BMI 37.40 kg/m   Physical Exam  Constitutional: She is oriented to person, place, and time. She appears well-developed and well-nourished. No distress.  HENT:  Head: Normocephalic and atraumatic.  Mouth/Throat: Oropharynx is clear and moist. No oropharyngeal exudate.  Eyes: Pupils are equal, round, and reactive to light. Conjunctivae and EOM are normal.  Neck: Normal range of motion. Neck supple.  No meningismus.  Cardiovascular: Normal rate, regular rhythm, normal heart sounds and intact distal pulses.  No murmur heard. Pulmonary/Chest: Effort normal and breath sounds normal. No respiratory distress.  Abdominal: Soft. There is no tenderness. There is no rebound and no guarding.  Musculoskeletal: Normal range of motion. She exhibits edema and tenderness.  Diffuse swelling to dorsal right foot.  Intact DP and PT pulse.  Diffuse erythema to third digit with ulceration on lateral aspect and split in skin on medial aspect.  No significant bleeding.  Minimal clear drainage. Ecchymosis to dorsal first toe See photos  Neurological: She is alert and oriented to person,  place, and time. No cranial nerve deficit. She exhibits normal muscle tone. Coordination normal.   5/5 strength throughout. CN 2-12 intact.Equal grip strength.   Skin: Skin is warm.  Psychiatric: She has a normal mood and affect. Her behavior is normal.  Nursing note and vitals reviewed.          ED Treatments / Results  Labs (all labs ordered are listed, but only abnormal results are displayed) Labs Reviewed  CBC WITH DIFFERENTIAL/PLATELET - Abnormal; Notable for the following components:      Result Value   WBC 12.0 (*)    Neutro Abs 8.7 (*)    All other components within normal limits    COMPREHENSIVE METABOLIC PANEL - Abnormal; Notable for the following components:   Glucose, Bld 130 (*)    Creatinine, Ser 1.03 (*)    AST 14 (*)    All other components within normal limits  CULTURE, BLOOD (ROUTINE X 2)  CULTURE, BLOOD (ROUTINE X 2)  SEDIMENTATION RATE  TSH  I-STAT CG4 LACTIC ACID, ED  I-STAT CG4 LACTIC ACID, ED    EKG None  Radiology Dg Foot Complete Right  Result Date: 04/11/2018 CLINICAL DATA:  Soreness, redness, and swelling of the third toe with bruising after injury 3 days ago. EXAM: RIGHT FOOT COMPLETE - 3+ VIEW COMPARISON:  None. FINDINGS: The toes are flexed, limiting evaluation. No acute fracture or dislocation is appreciated. Soft tissue swelling over the dorsum of the foot. No radiopaque foreign bodies or soft tissue gas collections. IMPRESSION: Soft tissue swelling. No acute bony abnormalities. Electronically Signed   By: William  Stevens M.D.   On: 04/11/2018 02:10   Dg Toe 3rd Right  Result Date: 04/11/2018 CLINICAL DATA:  Soreness, redness, and swelling with bruising in the right third toe. EXAM: RIGHT THIRD TOE COMPARISON:  Right foot 04/11/2018 FINDINGS: There is no evidence of fracture or dislocation. There is no evidence of arthropathy or other focal bone abnormality. Soft tissues are unremarkable. IMPRESSION: Negative. Electronically Signed   By:  William  Stevens M.D.   On: 04/11/2018 03:12    Procedures Procedures (including critical care time)  Medications Ordered in ED Medications - No data to display   Initial Impression / Assessment and Plan / ED Course  I have reviewed the triage vital signs and the nursing notes.  Pertinent labs & imaging results that were available during my care of the patient were reviewed by me and considered in my medical decision making (see chart for details).    Patient with history of "prediabetes" as well as peripheral neuropathy presenting with third right toe pain, redness and swelling for the past several days.  Intact distal pulses.  X-rays negative for fracture.  No evidence of osteomyelitis on plain film. WBC 12. ESR normal. Patient appears well and nontoxic.   Admission versus discharge discussed with patient.  She appears well.  She wishes to go home.  Will start on antibiotics for cellulitis.  Discussed with patient that she is certainly at risk for losing this toe but she appears well,  There is no evidence of osteomyelitis on plain film and an outpatient trial of PO antibiotics seems reasonable.  Did offer admission for IV antibiotics and further imaging of the toe but patient declines.  Follow-up with orthopedics and/or podiatry in 2 days. Patient understands to return with worsening redness, pain, fever or any other concerns. She knows to return to the ED sooner if she has worsening symptoms.  Final Clinical Impressions(s) / ED Diagnoses   Final diagnoses:  Cellulitis of toe of right foot    ED Discharge Orders    None       Rancour, Stephen, MD 04/11/18 0718  

## 2018-04-16 LAB — CULTURE, BLOOD (ROUTINE X 2)
CULTURE: NO GROWTH
Culture: NO GROWTH
Special Requests: ADEQUATE

## 2018-11-03 ENCOUNTER — Other Ambulatory Visit: Payer: Self-pay | Admitting: Podiatry

## 2018-11-04 NOTE — Patient Instructions (Signed)
Jordan Reeves  11/04/2018     @PREFPERIOPPHARMACY @   Your procedure is scheduled on 11/10/18.  Report to [facility] at 12:15 A.M.  Call this number if you have problems the morning of surgery:  [phone]   Remember:  Do not eat or drink after midnight.  Take these medicines the morning of surgery with A SIP OF WATER labetalol, levothyroxine & procardia.    Do not wear jewelry, make-up or nail polish.  Do not wear lotions, powders, or perfumes, or deodorant.  Do not shave 48 hours prior to surgery.  Men may shave face and neck.  Do not bring valuables to the hospital.  [facility] is not responsible for any belongings or valuables.  Contacts, dentures or bridgework may not be worn into surgery.  Leave your suitcase in the car.  After surgery it may be brought to your room.  For patients admitted to the hospital, discharge time will be determined by your treatment team.  Patients discharged the day of surgery will not be allowed to drive home.   Name and phone number of your driver:   family Special instructions:    Please read over the following fact sheets that you were given. Pain Booklet, Surgical Site Infection Prevention and Anesthesia Post-op Instructions      PATIENT INSTRUCTIONS POST-ANESTHESIA  IMMEDIATELY FOLLOWING SURGERY:  Do not drive or operate machinery for the first twenty four hours after surgery.  Do not make any important decisions for twenty four hours after surgery or while taking narcotic pain medications or sedatives.  If you develop intractable nausea and vomiting or a severe headache please notify your doctor immediately.  FOLLOW-UP:  Please make an appointment with your surgeon as instructed. You do not need to follow up with anesthesia unless specifically instructed to do so.  WOUND CARE INSTRUCTIONS (if applicable):  Keep a dry clean dressing on the anesthesia/puncture wound site if there is drainage.  Once the wound has quit draining  you may leave it open to air.  Generally you should leave the bandage intact for twenty four hours unless there is drainage.  If the epidural site drains for more than 36-48 hours please call the anesthesia department.  QUESTIONS?:  Please feel free to call your physician or the hospital operator if you have any questions, and they will be happy to assist you.      Toe Amputation Toe amputation is a surgical procedure to remove all or part of a toe. You may have this procedure if:  Tissue in your toe is dying because of poor blood supply.  You have a severe infection in your toe. Removing your toe keeps nearby tissue healthy. If the toe is infected, removing it helps to keep the infection from spreading. Tell a health care provider about:  Any allergies you have.  All medicines you are taking, including vitamins, herbs, eye drops, creams, and over-the-counter medicines.  Any problems you or family members have had with anesthetic medicines.  Any blood disorders you have.  Any surgeries you have had.  Any medical conditions you have.  Whether you are pregnant or may be pregnant. What are the risks? Generally, this is a safe procedure. However, problems may occur, including:  Bleeding.  Buildup of blood and fluid (hematoma).  Infection.  Allergic reactions to medicines.  Tissue death in the flap of skin (flap necrosis).  Trouble with healing.  Minor changes in the way that you walk (your gait).  Feeling pain in the area that was removed (phantom pain). This is rare. What happens before the procedure?  Follow instructions from your health care provider about eating or drinking restrictions.  Ask your health care provider about: ? Changing or stopping your regular medicines. This is especially important if you are taking diabetes medicines or blood thinners. ? Taking medicines such as aspirin and ibuprofen. These medicines can thin your blood. Do not take these  medicines before your procedure if your health care provider instructs you not to.  You may be given antibiotic medicine to help prevent infection.  Plan to have someone take you home after the procedure.  If you will be going home right after the procedure, plan to have someone with you for 24 hours. What happens during the procedure?  You will be given one or more of the following: ? A medicine to help you relax (sedative). ? A medicine to numb the area (local anesthetic). ? A medicine to make you fall asleep (general anesthetic). ? A medicine that is injected into your spine to numb the area below and slightly above the injection site (spinal anesthetic). ? A medicine that is injected into an area of your body to numb everything below the injection site (regional anesthetic).  To reduce your risk of infection: ? Your health care team will wash or sanitize their hands. ? Your skin will be washed with soap.  Your surgeon will mark the area of your toe for removal.  Your surgeon will make a surgical cut (incision) in your toe.  The dead tissue and bone will be removed.  Nerves and vessels will be tied or heated with a special tool to stop bleeding.  The area will be drained and cleaned.  If only part of a toe is removed, the remaining part will be covered with a flap of skin.  The incision will be treated in one of these ways: ? It will be closed with stitches (sutures). ? It will be left open to heal if there is an infection.  The incision area may be packed with gauze and covered with bandages (dressings).  Tissue samples may be sent to a lab to be examined under a microscope. The procedure may vary among health care providers and hospitals. What happens after the procedure?  Your blood pressure, heart rate, breathing rate, and blood oxygen level will be monitored often until the medicines you were given have worn off.  Your foot will be raised up high (elevated) to  relieve swelling.  You will be monitored for pain.  You will be given pain medicines and antibiotics.  Your health care provider or physical therapist will help you to move around as soon as possible.  Do not drive for 24 hours if you received a sedative. This information is not intended to replace advice given to you by your health care provider. Make sure you discuss any questions you have with your health care provider. Document Released: 07/23/2015 Document Revised: 04/30/2018 Document Reviewed: 05/05/2015 Elsevier Interactive Patient Education  2019 [employer].

## 2018-11-08 ENCOUNTER — Ambulatory Visit (HOSPITAL_COMMUNITY)
Admission: RE | Admit: 2018-11-08 | Discharge: 2018-11-08 | Disposition: A | Discharge status: Home / Self Care | Payer: BLUE CROSS/BLUE SHIELD | Source: Ambulatory Visit | Attending: Podiatry | Admitting: Podiatry

## 2018-11-08 ENCOUNTER — Other Ambulatory Visit: Payer: Self-pay

## 2018-11-08 ENCOUNTER — Encounter (HOSPITAL_COMMUNITY)
Admission: RE | Admit: 2018-11-08 | Discharge: 2018-11-08 | Disposition: A | Discharge status: Home / Self Care | Payer: BLUE CROSS/BLUE SHIELD | Source: Ambulatory Visit | Attending: Podiatry | Admitting: Podiatry

## 2018-11-08 ENCOUNTER — Other Ambulatory Visit (HOSPITAL_COMMUNITY): Payer: Self-pay | Admitting: Podiatry

## 2018-11-08 DIAGNOSIS — M869 Osteomyelitis, unspecified: Secondary | ICD-10-CM

## 2018-11-08 LAB — BASIC METABOLIC PANEL
ANION GAP: 8 (ref 5–15)
BUN: 15 mg/dL (ref 6–20)
CALCIUM: 9.1 mg/dL (ref 8.9–10.3)
CO2: 25 mmol/L (ref 22–32)
Chloride: 105 mmol/L (ref 98–111)
Creatinine, Ser: 0.81 mg/dL (ref 0.44–1.00)
GLUCOSE: 122 mg/dL — AB (ref 70–99)
Potassium: 4.2 mmol/L (ref 3.5–5.1)
SODIUM: 138 mmol/L (ref 135–145)

## 2018-11-08 LAB — HCG, SERUM, QUALITATIVE: PREG SERUM: NEGATIVE

## 2018-11-08 LAB — HEMOGLOBIN A1C
HEMOGLOBIN A1C: 6.7 % — AB (ref 4.8–5.6)
Mean Plasma Glucose: 145.59 mg/dL

## 2018-11-08 LAB — GLUCOSE, CAPILLARY: Glucose-Capillary: 113 mg/dL — ABNORMAL HIGH (ref 70–99)

## 2018-11-09 NOTE — Pre-Procedure Instructions (Signed)
HgbA1C routed to PCP. 

## 2018-11-10 ENCOUNTER — Ambulatory Visit (HOSPITAL_COMMUNITY): Payer: BLUE CROSS/BLUE SHIELD | Admitting: Anesthesiology

## 2018-11-10 ENCOUNTER — Ambulatory Visit (HOSPITAL_COMMUNITY)
Admission: RE | Admit: 2018-11-10 | Discharge: 2018-11-10 | Disposition: A | Discharge status: Home / Self Care | Payer: BLUE CROSS/BLUE SHIELD | Attending: Podiatry | Admitting: Podiatry

## 2018-11-10 ENCOUNTER — Encounter (HOSPITAL_COMMUNITY): Payer: Self-pay | Admitting: *Deleted

## 2018-11-10 ENCOUNTER — Other Ambulatory Visit: Payer: Self-pay

## 2018-11-10 ENCOUNTER — Encounter (HOSPITAL_COMMUNITY)
Admission: RE | Disposition: A | Discharge status: Home / Self Care | Payer: Self-pay | Source: Home / Self Care | Attending: Podiatry

## 2018-11-10 ENCOUNTER — Ambulatory Visit (HOSPITAL_COMMUNITY): Payer: BLUE CROSS/BLUE SHIELD

## 2018-11-10 DIAGNOSIS — M869 Osteomyelitis, unspecified: Secondary | ICD-10-CM | POA: Insufficient documentation

## 2018-11-10 DIAGNOSIS — Z79899 Other long term (current) drug therapy: Secondary | ICD-10-CM | POA: Diagnosis not present

## 2018-11-10 DIAGNOSIS — Z793 Long term (current) use of hormonal contraceptives: Secondary | ICD-10-CM | POA: Insufficient documentation

## 2018-11-10 DIAGNOSIS — I10 Essential (primary) hypertension: Secondary | ICD-10-CM | POA: Insufficient documentation

## 2018-11-10 DIAGNOSIS — Z9889 Other specified postprocedural states: Secondary | ICD-10-CM

## 2018-11-10 DIAGNOSIS — E039 Hypothyroidism, unspecified: Secondary | ICD-10-CM | POA: Insufficient documentation

## 2018-11-10 DIAGNOSIS — E1169 Type 2 diabetes mellitus with other specified complication: Secondary | ICD-10-CM | POA: Insufficient documentation

## 2018-11-10 HISTORY — PX: AMPUTATION: SHX166

## 2018-11-10 LAB — GLUCOSE, CAPILLARY
Glucose-Capillary: 135 mg/dL — ABNORMAL HIGH (ref 70–99)
Glucose-Capillary: 110 mg/dL — ABNORMAL HIGH (ref 70–99)

## 2018-11-10 SURGERY — AMPUTATION DIGIT
Anesthesia: Monitor Anesthesia Care | Site: Toe | Laterality: Left

## 2018-11-10 MED ORDER — CHLORHEXIDINE GLUCONATE CLOTH 2 % EX PADS: 6.0000 | MEDICATED_PAD | Freq: Once | CUTANEOUS | Status: DC

## 2018-11-10 MED ORDER — BUPIVACAINE HCL (PF) 0.5 % IJ SOLN
INTRAMUSCULAR | Status: AC
  Filled 2018-11-10: qty 30

## 2018-11-10 MED ORDER — FENTANYL CITRATE (PF) 100 MCG/2ML IJ SOLN
INTRAMUSCULAR | Status: AC
  Filled 2018-11-10: qty 2

## 2018-11-10 MED ORDER — PROPOFOL 10 MG/ML IV BOLUS
INTRAVENOUS | Status: DC | PRN
  Administered 2018-11-10: 20 mg via INTRAVENOUS

## 2018-11-10 MED ORDER — LACTATED RINGERS IV SOLN
INTRAVENOUS | Status: DC | PRN
  Administered 2018-11-10: 12:00:00 via INTRAVENOUS

## 2018-11-10 MED ORDER — LIDOCAINE HCL (PF) 1 % IJ SOLN
INTRAMUSCULAR | Status: AC
  Filled 2018-11-10: qty 30

## 2018-11-10 MED ORDER — MIDAZOLAM HCL 2 MG/2ML IJ SOLN
INTRAMUSCULAR | Status: AC
  Filled 2018-11-10: qty 2

## 2018-11-10 MED ORDER — LIDOCAINE HCL (PF) 1 % IJ SOLN
INTRAMUSCULAR | Status: DC | PRN
  Administered 2018-11-10: 10 mL

## 2018-11-10 MED ORDER — 0.9 % SODIUM CHLORIDE (POUR BTL) OPTIME
TOPICAL | Status: DC | PRN
  Administered 2018-11-10: 1000 mL

## 2018-11-10 MED ORDER — PROPOFOL 10 MG/ML IV BOLUS
INTRAVENOUS | Status: AC
  Filled 2018-11-10: qty 40

## 2018-11-10 MED ORDER — PROPOFOL 500 MG/50ML IV EMUL
INTRAVENOUS | Status: DC | PRN
  Administered 2018-11-10: 13:00:00 via INTRAVENOUS
  Administered 2018-11-10: 125 ug/kg/min via INTRAVENOUS

## 2018-11-10 MED ORDER — MIDAZOLAM HCL 5 MG/5ML IJ SOLN
INTRAMUSCULAR | Status: DC | PRN
  Administered 2018-11-10: 2 mg via INTRAVENOUS

## 2018-11-10 MED ORDER — FENTANYL CITRATE (PF) 100 MCG/2ML IJ SOLN
INTRAMUSCULAR | Status: DC | PRN
  Administered 2018-11-10: 50 ug via INTRAVENOUS

## 2018-11-10 SURGICAL SUPPLY — 48 items
APL SKNCLS STERI-STRIP NONHPOA (GAUZE/BANDAGES/DRESSINGS) ×1
BANDAGE ELASTIC 4 LF NS (GAUZE/BANDAGES/DRESSINGS) ×3 IMPLANT
BENZOIN TINCTURE PRP APPL 2/3 (GAUZE/BANDAGES/DRESSINGS) ×2 IMPLANT
BLADE AVERAGE 25MMX9MM (BLADE) ×1
BLADE AVERAGE 25X9 (BLADE) ×2 IMPLANT
BLADE SURG 15 STRL LF DISP TIS (BLADE) ×1 IMPLANT
BLADE SURG 15 STRL SS (BLADE) ×3
BNDG CMPR MED 5X4 ELC HKLP NS (GAUZE/BANDAGES/DRESSINGS) ×1
BNDG CONFORM 2 STRL LF (GAUZE/BANDAGES/DRESSINGS) ×3 IMPLANT
BNDG GAUZE ELAST 4 BULKY (GAUZE/BANDAGES/DRESSINGS) ×2 IMPLANT
CLOSURE WOUND 1/2 X4 (GAUZE/BANDAGES/DRESSINGS) ×1
CLOTH BEACON ORANGE TIMEOUT ST (SAFETY) ×3 IMPLANT
COVER LIGHT HANDLE STERIS (MISCELLANEOUS) ×3 IMPLANT
CUFF TOURNIQUET SINGLE 18IN (TOURNIQUET CUFF) ×3 IMPLANT
DECANTER SPIKE VIAL GLASS SM (MISCELLANEOUS) ×6 IMPLANT
DRSG ADAPTIC 3X8 NADH LF (GAUZE/BANDAGES/DRESSINGS) ×3 IMPLANT
ELECT REM PT RETURN 9FT ADLT (ELECTROSURGICAL) ×3
ELECTRODE REM PT RTRN 9FT ADLT (ELECTROSURGICAL) ×1 IMPLANT
GAUZE SPONGE 4X4 12PLY STRL (GAUZE/BANDAGES/DRESSINGS) ×3 IMPLANT
GLOVE BIO SURGEON STRL SZ7.5 (GLOVE) ×3 IMPLANT
GLOVE BIOGEL PI IND STRL 7.0 (GLOVE) ×2 IMPLANT
GLOVE BIOGEL PI IND STRL 7.5 (GLOVE) ×1 IMPLANT
GLOVE BIOGEL PI INDICATOR 7.0 (GLOVE) ×4
GLOVE BIOGEL PI INDICATOR 7.5 (GLOVE) ×2
GLOVE ECLIPSE 7.0 STRL STRAW (GLOVE) ×6 IMPLANT
GOWN STRL REUS W/ TWL XL LVL3 (GOWN DISPOSABLE) ×1 IMPLANT
GOWN STRL REUS W/TWL LRG LVL3 (GOWN DISPOSABLE) ×6 IMPLANT
GOWN STRL REUS W/TWL XL LVL3 (GOWN DISPOSABLE) ×3
HANDPIECE INTERPULSE COAX TIP (DISPOSABLE) ×3
KIT TURNOVER KIT A (KITS) ×3 IMPLANT
MANIFOLD NEPTUNE II (INSTRUMENTS) ×3 IMPLANT
NDL HYPO 25X1 1.5 SAFETY (NEEDLE) ×2 IMPLANT
NDL HYPO 27GX1-1/4 (NEEDLE) ×2 IMPLANT
NEEDLE HYPO 25X1 1.5 SAFETY (NEEDLE) ×9 IMPLANT
NEEDLE HYPO 27GX1-1/4 (NEEDLE) ×6 IMPLANT
NS IRRIG 1000ML POUR BTL (IV SOLUTION) ×3 IMPLANT
PACK BASIC LIMB (CUSTOM PROCEDURE TRAY) ×3 IMPLANT
PAD ARMBOARD 7.5X6 YLW CONV (MISCELLANEOUS) ×3 IMPLANT
RASP SM TEAR CROSS CUT (RASP) IMPLANT
SET BASIN LINEN APH (SET/KITS/TRAYS/PACK) ×3 IMPLANT
SET HNDPC FAN SPRY TIP SCT (DISPOSABLE) ×1 IMPLANT
STRIP CLOSURE SKIN 1/2X4 (GAUZE/BANDAGES/DRESSINGS) ×1 IMPLANT
SUT PROLENE 3 0 PS 1 (SUTURE) ×3 IMPLANT
SUT VIC AB 3-0 SH 27 (SUTURE) ×3
SUT VIC AB 3-0 SH 27X BRD (SUTURE) ×1 IMPLANT
SWAB CULTURE LIQ STUART DBL (MISCELLANEOUS) ×2 IMPLANT
SYR CONTROL 10ML LL (SYRINGE) ×8 IMPLANT
TUBE ANAEROBIC PORT A CUL  W/M (MISCELLANEOUS) ×2 IMPLANT

## 2018-11-10 NOTE — Transfer of Care (Signed)
Immediate Anesthesia Transfer of Care Note  Patient: Jordan Reeves  Procedure(s) Performed: AMPUTATION TOE 4TH (Left Toe)  Patient Location: PACU  Anesthesia Type:MAC  Level of Consciousness: awake and patient cooperative  Airway & Oxygen Therapy: Patient Spontanous Breathing  Post-op Assessment: Report given to RN, Post -op Vital signs reviewed and stable and Patient moving all extremities  Post vital signs: Reviewed and stable  Last Vitals:  Vitals Value Taken Time  BP    Temp    Pulse 77 11/10/2018  1:51 PM  Resp    SpO2 96 % 11/10/2018  1:51 PM  Vitals shown include unvalidated device data.  Last Pain:  Vitals:   11/10/18 1115  TempSrc: Oral  PainSc: 0-No pain      Patients Stated Pain Goal: 10 (30/05/11 0211)  Complications: No apparent anesthesia complications

## 2018-11-10 NOTE — Anesthesia Preprocedure Evaluation (Signed)
Anesthesia Evaluation  Patient identified by MRN, date of birth, ID band Patient awake    Reviewed: Allergy & Precautions, NPO status , Patient's Chart, lab work & pertinent test results  Airway Mallampati: III  TM Distance: >3 FB Neck ROM: Full    Dental no notable dental hx. (+) Teeth Intact   Pulmonary neg pulmonary ROS,    Pulmonary exam normal breath sounds clear to auscultation       Cardiovascular Exercise Tolerance: Good hypertension, Pt. on medications Normal cardiovascular exam+ dysrhythmias I Rhythm:Regular Rate:Normal  States being worked up -needs a stress test    Neuro/Psych negative neurological ROS  negative psych ROS   GI/Hepatic negative GI ROS, Neg liver ROS,   Endo/Other  diabetes, Type 2Hypothyroidism Diet controlled   Renal/GU negative Renal ROS  negative genitourinary   Musculoskeletal negative musculoskeletal ROS (+)   Abdominal   Peds negative pediatric ROS (+)  Hematology negative hematology ROS (+)   Anesthesia Other Findings   Reproductive/Obstetrics negative OB ROS                             Anesthesia Physical Anesthesia Plan  ASA: II  Anesthesia Plan: MAC   Post-op Pain Management:    Induction: Intravenous  PONV Risk Score and Plan:   Airway Management Planned: Nasal Cannula and Simple Face Mask  Additional Equipment:   Intra-op Plan:   Post-operative Plan: Extubation in OR  Informed Consent: I have reviewed the patients History and Physical, chart, labs and discussed the procedure including the risks, benefits and alternatives for the proposed anesthesia with the patient or authorized representative who has indicated his/her understanding and acceptance.     Dental advisory given  Plan Discussed with: CRNA  Anesthesia Plan Comments: (Mostly neuropathic , ok to proceed with MAC GA as needed )        Anesthesia Quick  Evaluation

## 2018-11-10 NOTE — Op Note (Signed)
11/10/2018  1:51 PM  PATIENT:  Jordan Reeves  48 y.o. 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   ANESTHESIA:   local and MAC  EBL:  5 mL    LOCAL MEDICATIONS USED:  LIDOCAINE 10cc pre-op.   SPECIMEN:  Source of Specimen:  Left fourth toe.   DISPOSITION OF SPECIMEN:  PATHOLOGY  TOURNIQUET:   Total Tourniquet Time Documented: Calf (Left) - 25 minutes Total: Calf (Left) - 25 minutes  Materials used: 3-0 Nylon.   .Patient was brought into the operating room laid supine on the operating table. Ankle tourniquet was applied to the surgical extremity. Following IV sedation, a local block was achieved using 10 cc of 1% plain lidocaine. The foot was the prepped, scrubbed and draped in aseptic manner.The left lower extremity was elevated for 2 minute the tourniquet on the surgical site was inflatted at 230mHG.  Attention was directed towards the left fourth toe. There is swelling and some redness to the left fourth toe. Fish mouth incision was planned to remove the fourth toe. Using a skin blade the incision was just near the MPJ. The toe was disarticulated at the MPJ and removed. The base of the proximal phalanx was strong and sent off as clean margin. The bone at the proximal phalanx was soft. Deep wound cultures were obtained. Wound was flushed with copious amount of normal saline. The skin was closed using 3-0 Nylon. Dry sterile dressing was applied. Tourniquet was deflated and capillary refill time was immediate.   Patient will be in surgical shoe with partial weightbearing to heel.

## 2018-11-10 NOTE — Discharge Instructions (Signed)
.These instructions will give you an idea of what to expect after surgery and how to manage issues that may arise before your first post op office visit.  Pain Management Pain is best managed by staying ahead of it. If pain gets out of control, it is difficult to get it back under control. Local anesthesia that lasts 6-8 hours is used to numb the foot and decrease pain.  For the best pain control, take the pain medication every 4 hours for the first 2 days post op. On the third day pain medication can be taken as needed.   Post Op Nausea Nausea is common after surgery, so it is managed proactively.  If prescribed, use the prescribed nausea medication regularly for the first 2 days post op.  Bandages Do not worry if there is blood on the bandage. What looks like a lot of blood on the bandage is actually a small amount. Blood on the dressing spreads out as it is absorbed by the gauze, the same way a drop of water spreads out on a paper towel.  If the bandages feel wet or dry, stiff and uncomfortable, call the office during office hours and we will schedule a time for you to have the bandage changed.  Unless you are specifically told otherwise, we will do the first bandage change in the office.  Keep your bandage dry. If the bandage becomes wet or soiled, notify the office and we will schedule a time to change the bandage.  Activity It is best to spend most of the first 2 days after surgery lying down with the foot elevated above the level of your heart. You may put weight on your heel while wearing the surgical shoe.   You may only get up to go to the restroom.  Driving Do not drive until you are able to respond in an emergency (i.e. slam on the brakes). This usually occurs after the bone has healed - 6 to 8 weeks.  Call the Office If you have a fever over 101F.  If you have increasing pain after the initial post op pain has settled down.  If you have increasing redness, swelling, or  drainage.  If you have any questions or concerns.        Monitored Anesthesia Care, Care After These instructions provide you with information about caring for yourself after your procedure. Your health care provider may also give you more specific instructions. Your treatment has been planned according to current medical practices, but problems sometimes occur. Call your health care provider if you have any problems or questions after your procedure. What can I expect after the procedure? After your procedure, you may:  Feel sleepy for several hours.  Feel clumsy and have poor balance for several hours.  Feel forgetful about what happened after the procedure.  Have poor judgment for several hours.  Feel nauseous or vomit.  Have a sore throat if you had a breathing tube during the procedure. Follow these instructions at home: For at least 24 hours after the procedure:      Have a responsible adult stay with you. It is important to have someone help care for you until you are awake and alert.  Rest as needed.  Do not: ? Participate in activities in which you could fall or become injured. ? Drive. ? Use heavy machinery. ? Drink alcohol. ? Take sleeping pills or medicines that cause drowsiness. ? Make important decisions or sign legal documents. ? Take  care of children on your own. Eating and drinking  Follow the diet that is recommended by your health care provider.  If you vomit, drink water, juice, or soup when you can drink without vomiting.  Make sure you have little or no nausea before eating solid foods. General instructions  Take over-the-counter and prescription medicines only as told by your health care provider.  If you have sleep apnea, surgery and certain medicines can increase your risk for breathing problems. Follow instructions from your health care provider about wearing your sleep device: ? Anytime you are sleeping, including during daytime  naps. ? While taking prescription pain medicines, sleeping medicines, or medicines that make you drowsy.  If you smoke, do not smoke without supervision.  Keep all follow-up visits as told by your health care provider. This is important. Contact a health care provider if:  You keep feeling nauseous or you keep vomiting.  You feel light-headed.  You develop a rash.  You have a fever. Get help right away if:  You have trouble breathing. Summary  For several hours after your procedure, you may feel sleepy and have poor judgment.  Have a responsible adult stay with you for at least 24 hours or until you are awake and alert. This information is not intended to replace advice given to you by your health care provider. Make sure you discuss any questions you have with your health care provider. Document Released: 12/02/2015 Document Revised: 03/27/2017 Document Reviewed: 12/02/2015 Elsevier Interactive Patient Education  2019 [employer].

## 2018-11-10 NOTE — Brief Op Note (Signed)
11/10/2018  1:51 PM  PATIENT:  Jordan Reeves  48 y.o. 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   ANESTHESIA:   local and MAC  EBL:  5 mL   BLOOD ADMINISTERED:none  DRAINS: none   LOCAL MEDICATIONS USED:  LIDOCAINE   SPECIMEN:  Source of Specimen:  Left fourth toe.   DISPOSITION OF SPECIMEN:  PATHOLOGY  COUNTS:  YES  TOURNIQUET:   Total Tourniquet Time Documented: Calf (Left) - 25 minutes Total: Calf (Left) - 25 minutes   DICTATION: .Viviann Spare Dictation  PLAN OF CARE: Discharge to home after PACU  PATIENT DISPOSITION:  PACU - hemodynamically stable.   Delay start of Pharmacological VTE agent (>24hrs) due to surgical blood loss or risk of bleeding: not applicable

## 2018-11-10 NOTE — Anesthesia Postprocedure Evaluation (Signed)
Anesthesia Post Note  Patient: Jordan Reeves  Procedure(s) Performed: AMPUTATION TOE 4TH (Left Toe)  Patient location during evaluation: PACU Anesthesia Type: MAC Level of consciousness: awake and alert and patient cooperative Pain management: satisfactory to patient Vital Signs Assessment: post-procedure vital signs reviewed and stable Respiratory status: spontaneous breathing Cardiovascular status: stable Postop Assessment: no apparent nausea or vomiting Anesthetic complications: no     Last Vitals:  Vitals:   11/10/18 1115 11/10/18 1351  BP: 139/81   Pulse: 78   Resp: 18   Temp: 36.7 C 36.8 C  SpO2: 98%     Last Pain:  Vitals:   11/10/18 1351  TempSrc:   PainSc: 0-No pain                 Torrin Frein

## 2018-11-10 NOTE — H&P (Signed)
.   HISTORY AND PHYSICAL INTERVAL NOTE:  11/10/2018  12:37 PM  Jordan Reeves  has presented today for surgery, with the diagnosis of osteomyelitis 4th toe left foot.  The various methods of treatment have been discussed with the patient.  No guarantees were given.  After consideration of risks, benefits and other options for treatment, the patient has consented to surgery.  I have reviewed the patients' chart and labs.    Patient Vitals for the past 24 hrs:  BP Temp Temp src Pulse Resp SpO2  11/10/18 1115 139/81 98.1 F (36.7 C) Oral 78 18 98 %    A history and physical examination was performed in my office.  The patient was reexamined.  There have been no changes to this history and physical examination.  Tyson Babinski, DPM

## 2018-11-10 NOTE — Anesthesia Postprocedure Evaluation (Signed)
Anesthesia Post Note  Patient: Jordan Reeves  Procedure(s) Performed: AMPUTATION TOE 4TH (Left Toe)  Patient location during evaluation: PACU Anesthesia Type: MAC Level of consciousness: awake and alert and patient cooperative Pain management: pain level controlled Respiratory status: spontaneous breathing, nonlabored ventilation and respiratory function stable Cardiovascular status: blood pressure returned to baseline Postop Assessment: no apparent nausea or vomiting Anesthetic complications: no     Last Vitals:  Vitals:   11/10/18 1115 11/10/18 1351  BP: 139/81   Pulse: 78   Resp: 18   Temp: 36.7 C 36.8 C  SpO2: 98%     Last Pain:  Vitals:   11/10/18 1351  TempSrc:   PainSc: 0-No pain                 Salli Bodin J

## 2018-11-11 ENCOUNTER — Encounter (HOSPITAL_COMMUNITY): Payer: Self-pay | Admitting: Podiatry

## 2018-11-15 LAB — AEROBIC/ANAEROBIC CULTURE W GRAM STAIN (SURGICAL/DEEP WOUND)

## 2019-02-14 ENCOUNTER — Other Ambulatory Visit: Payer: Self-pay

## 2019-02-14 ENCOUNTER — Encounter: Payer: Self-pay | Admitting: Orthopedic Surgery

## 2019-02-14 ENCOUNTER — Ambulatory Visit (INDEPENDENT_AMBULATORY_CARE_PROVIDER_SITE_OTHER): Payer: BC Managed Care – PPO | Admitting: Orthopedic Surgery

## 2019-02-14 VITALS — Ht 68.0 in | Wt 245.0 lb

## 2019-02-14 DIAGNOSIS — M6702 Short Achilles tendon (acquired), left ankle: Secondary | ICD-10-CM
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
## 2019-03-09 ENCOUNTER — Other Ambulatory Visit: Payer: Self-pay | Admitting: Family

## 2019-03-15 ENCOUNTER — Encounter (HOSPITAL_BASED_OUTPATIENT_CLINIC_OR_DEPARTMENT_OTHER): Payer: Self-pay | Admitting: *Deleted

## 2019-03-15 ENCOUNTER — Other Ambulatory Visit: Payer: Self-pay

## 2019-03-18 ENCOUNTER — Encounter (HOSPITAL_BASED_OUTPATIENT_CLINIC_OR_DEPARTMENT_OTHER)
Admission: RE | Admit: 2019-03-18 | Discharge: 2019-03-18 | Disposition: A | Discharge status: Home / Self Care | Payer: BC Managed Care – PPO | Source: Ambulatory Visit | Attending: Orthopedic Surgery | Admitting: Orthopedic Surgery

## 2019-03-18 ENCOUNTER — Other Ambulatory Visit (HOSPITAL_COMMUNITY)
Admission: RE | Admit: 2019-03-18 | Discharge: 2019-03-18 | Disposition: A | Discharge status: Home / Self Care | Payer: BC Managed Care – PPO | Source: Ambulatory Visit | Attending: Orthopedic Surgery | Admitting: Orthopedic Surgery

## 2019-03-18 DIAGNOSIS — M6702 Short Achilles tendon (acquired), left ankle: Secondary | ICD-10-CM | POA: Insufficient documentation

## 2019-03-18 DIAGNOSIS — Z1159 Encounter for screening for other viral diseases: Secondary | ICD-10-CM | POA: Diagnosis not present

## 2019-03-18 DIAGNOSIS — Z01818 Encounter for other preprocedural examination: Secondary | ICD-10-CM | POA: Insufficient documentation

## 2019-03-19 LAB — SARS CORONAVIRUS 2 (TAT 6-24 HRS): SARS Coronavirus 2: NEGATIVE

## 2019-03-22 ENCOUNTER — Encounter (HOSPITAL_BASED_OUTPATIENT_CLINIC_OR_DEPARTMENT_OTHER)
Admission: RE | Disposition: A | Discharge status: Home / Self Care | Payer: Self-pay | Source: Home / Self Care | Attending: Orthopedic Surgery

## 2019-03-22 ENCOUNTER — Ambulatory Visit (HOSPITAL_BASED_OUTPATIENT_CLINIC_OR_DEPARTMENT_OTHER): Payer: BC Managed Care – PPO | Admitting: Anesthesiology

## 2019-03-22 ENCOUNTER — Ambulatory Visit (HOSPITAL_BASED_OUTPATIENT_CLINIC_OR_DEPARTMENT_OTHER)
Admission: RE | Admit: 2019-03-22 | Discharge: 2019-03-22 | Disposition: A | Discharge status: Home / Self Care | Payer: BC Managed Care – PPO | Attending: Orthopedic Surgery | Admitting: Orthopedic Surgery

## 2019-03-22 ENCOUNTER — Other Ambulatory Visit: Payer: Self-pay

## 2019-03-22 ENCOUNTER — Encounter (HOSPITAL_BASED_OUTPATIENT_CLINIC_OR_DEPARTMENT_OTHER): Payer: Self-pay | Admitting: *Deleted

## 2019-03-22 DIAGNOSIS — Z79890 Hormone replacement therapy (postmenopausal): Secondary | ICD-10-CM | POA: Insufficient documentation

## 2019-03-22 DIAGNOSIS — E119 Type 2 diabetes mellitus without complications: Secondary | ICD-10-CM | POA: Diagnosis not present

## 2019-03-22 DIAGNOSIS — E063 Autoimmune thyroiditis: Secondary | ICD-10-CM | POA: Diagnosis not present

## 2019-03-22 DIAGNOSIS — Z89422 Acquired absence of other left toe(s): Secondary | ICD-10-CM | POA: Diagnosis not present

## 2019-03-22 DIAGNOSIS — Z79899 Other long term (current) drug therapy: Secondary | ICD-10-CM | POA: Insufficient documentation

## 2019-03-22 DIAGNOSIS — I10 Essential (primary) hypertension: Secondary | ICD-10-CM | POA: Diagnosis not present

## 2019-03-22 DIAGNOSIS — M6702 Short Achilles tendon (acquired), left ankle: Secondary | ICD-10-CM

## 2019-03-22 DIAGNOSIS — Z794 Long term (current) use of insulin: Secondary | ICD-10-CM | POA: Insufficient documentation

## 2019-03-22 DIAGNOSIS — Z6838 Body mass index (BMI) 38.0-38.9, adult: Secondary | ICD-10-CM | POA: Insufficient documentation

## 2019-03-22 HISTORY — PX: ACHILLES TENDON LENGTHENING: SHX6455

## 2019-03-22 LAB — GLUCOSE, CAPILLARY
Glucose-Capillary: 152 mg/dL — ABNORMAL HIGH (ref 70–99)
Glucose-Capillary: 166 mg/dL — ABNORMAL HIGH (ref 70–99)

## 2019-03-22 LAB — POCT PREGNANCY, URINE: Preg Test, Ur: NEGATIVE

## 2019-03-22 SURGERY — LENGTHENING, TENDON, ACHILLES
Anesthesia: General | Site: Ankle | Laterality: Left

## 2019-03-22 MED ORDER — FENTANYL CITRATE (PF) 100 MCG/2ML IJ SOLN
50.0000 ug | INTRAMUSCULAR | Status: DC | PRN
  Administered 2019-03-22: 50 ug via INTRAVENOUS

## 2019-03-22 MED ORDER — MIDAZOLAM HCL 2 MG/2ML IJ SOLN
INTRAMUSCULAR | Status: AC
  Filled 2019-03-22: qty 2

## 2019-03-22 MED ORDER — CEFAZOLIN SODIUM-DEXTROSE 2-4 GM/100ML-% IV SOLN
INTRAVENOUS | Status: AC
  Filled 2019-03-22: qty 100

## 2019-03-22 MED ORDER — DEXAMETHASONE SODIUM PHOSPHATE 4 MG/ML IJ SOLN
INTRAMUSCULAR | Status: DC | PRN
  Administered 2019-03-22: 5 mg via INTRAVENOUS

## 2019-03-22 MED ORDER — PROPOFOL 10 MG/ML IV BOLUS
INTRAVENOUS | Status: DC | PRN
  Administered 2019-03-22: 150 mg via INTRAVENOUS

## 2019-03-22 MED ORDER — LACTATED RINGERS IV SOLN
INTRAVENOUS | Status: DC
  Administered 2019-03-22: 07:00:00 via INTRAVENOUS

## 2019-03-22 MED ORDER — LIDOCAINE HCL (CARDIAC) PF 100 MG/5ML IV SOSY
PREFILLED_SYRINGE | INTRAVENOUS | Status: DC | PRN
  Administered 2019-03-22: 100 mg via INTRAVENOUS

## 2019-03-22 MED ORDER — PROMETHAZINE HCL 25 MG/ML IJ SOLN: 6.2500 mg | INTRAMUSCULAR | Status: DC | PRN

## 2019-03-22 MED ORDER — ONDANSETRON HCL 4 MG/2ML IJ SOLN
INTRAMUSCULAR | Status: AC
  Filled 2019-03-22: qty 2

## 2019-03-22 MED ORDER — LIDOCAINE 2% (20 MG/ML) 5 ML SYRINGE
INTRAMUSCULAR | Status: AC
  Filled 2019-03-22: qty 5

## 2019-03-22 MED ORDER — DEXAMETHASONE SODIUM PHOSPHATE 10 MG/ML IJ SOLN
INTRAMUSCULAR | Status: AC
  Filled 2019-03-22: qty 1

## 2019-03-22 MED ORDER — FENTANYL CITRATE (PF) 100 MCG/2ML IJ SOLN
INTRAMUSCULAR | Status: AC
  Filled 2019-03-22: qty 2

## 2019-03-22 MED ORDER — CHLORHEXIDINE GLUCONATE 4 % EX LIQD: 60.0000 mL | Freq: Once | CUTANEOUS | Status: DC

## 2019-03-22 MED ORDER — BUPIVACAINE HCL (PF) 0.25 % IJ SOLN
INTRAMUSCULAR | Status: AC
  Filled 2019-03-22: qty 90

## 2019-03-22 MED ORDER — SCOPOLAMINE 1 MG/3DAYS TD PT72: 1.0000 | MEDICATED_PATCH | Freq: Once | TRANSDERMAL | Status: DC

## 2019-03-22 MED ORDER — PROPOFOL 500 MG/50ML IV EMUL
INTRAVENOUS | Status: AC
  Filled 2019-03-22: qty 50

## 2019-03-22 MED ORDER — FENTANYL CITRATE (PF) 100 MCG/2ML IJ SOLN: 25.0000 ug | INTRAMUSCULAR | Status: DC | PRN

## 2019-03-22 MED ORDER — BUPIVACAINE HCL (PF) 0.5 % IJ SOLN
INTRAMUSCULAR | Status: AC
  Filled 2019-03-22: qty 60

## 2019-03-22 MED ORDER — MIDAZOLAM HCL 2 MG/2ML IJ SOLN
1.0000 mg | INTRAMUSCULAR | Status: DC | PRN
  Administered 2019-03-22: 2 mg via INTRAVENOUS

## 2019-03-22 MED ORDER — CEFAZOLIN SODIUM-DEXTROSE 2-4 GM/100ML-% IV SOLN
2.0000 g | INTRAVENOUS | Status: AC
  Administered 2019-03-22: 2 g via INTRAVENOUS

## 2019-03-22 MED ORDER — ONDANSETRON HCL 4 MG/2ML IJ SOLN
INTRAMUSCULAR | Status: DC | PRN
  Administered 2019-03-22: 4 mg via INTRAVENOUS

## 2019-03-22 SURGICAL SUPPLY — 44 items
BLADE MINI RND TIP GREEN BEAV (BLADE) ×2 IMPLANT
BLADE SURG 10 STRL SS (BLADE) ×3 IMPLANT
BLADE SURG 15 STRL LF DISP TIS (BLADE) ×1 IMPLANT
BLADE SURG 15 STRL SS (BLADE) ×3
BNDG COHESIVE 6X5 TAN STRL LF (GAUZE/BANDAGES/DRESSINGS) ×2 IMPLANT
BOOT STEPPER DURA LG (SOFTGOODS) ×2 IMPLANT
COVER BACK TABLE REUSABLE LG (DRAPES) ×3 IMPLANT
COVER MAYO STAND REUSABLE (DRAPES) ×1 IMPLANT
COVER WAND RF STERILE (DRAPES) IMPLANT
DRAPE EXTREMITY T 121X128X90 (DISPOSABLE) ×3 IMPLANT
DRAPE IMP U-DRAPE 54X76 (DRAPES) ×2 IMPLANT
DRAPE U-SHAPE 47X51 STRL (DRAPES) ×3 IMPLANT
DRSG EMULSION OIL 3X3 NADH (GAUZE/BANDAGES/DRESSINGS) ×1 IMPLANT
DURAPREP 26ML APPLICATOR (WOUND CARE) ×3 IMPLANT
ELECT REM PT RETURN 9FT ADLT (ELECTROSURGICAL) ×3
ELECTRODE REM PT RTRN 9FT ADLT (ELECTROSURGICAL) IMPLANT
GAUZE 4X4 16PLY RFD (DISPOSABLE) IMPLANT
GAUZE SPONGE 4X4 12PLY STRL (GAUZE/BANDAGES/DRESSINGS) ×3 IMPLANT
GLOVE BIOGEL PI IND STRL 7.0 (GLOVE) IMPLANT
GLOVE BIOGEL PI IND STRL 8.5 (GLOVE) ×1 IMPLANT
GLOVE BIOGEL PI INDICATOR 7.0 (GLOVE) ×4
GLOVE BIOGEL PI INDICATOR 8.5 (GLOVE) ×2
GLOVE ECLIPSE 6.5 STRL STRAW (GLOVE) ×2 IMPLANT
GLOVE SURG ORTHO 8.5 STRL (GLOVE) ×3 IMPLANT
GLOVE SURG ORTHO 9.0 STRL STRW (GLOVE) ×3 IMPLANT
GOWN SPEC L3 XXLG W/TWL (GOWN DISPOSABLE) ×2 IMPLANT
GOWN STRL REUS W/ TWL LRG LVL3 (GOWN DISPOSABLE) ×1 IMPLANT
GOWN STRL REUS W/ TWL XL LVL3 (GOWN DISPOSABLE) ×1 IMPLANT
GOWN STRL REUS W/TWL LRG LVL3 (GOWN DISPOSABLE) ×3
GOWN STRL REUS W/TWL XL LVL3 (GOWN DISPOSABLE)
NDL HYPO 25X1 1.5 SAFETY (NEEDLE) IMPLANT
NEEDLE HYPO 25X1 1.5 SAFETY (NEEDLE) ×3 IMPLANT
NS IRRIG 1000ML POUR BTL (IV SOLUTION) ×2 IMPLANT
PACK BASIN DAY SURGERY FS (CUSTOM PROCEDURE TRAY) ×3 IMPLANT
PADDING CAST ABS 4INX4YD NS (CAST SUPPLIES)
PADDING CAST ABS COTTON 4X4 ST (CAST SUPPLIES) ×1 IMPLANT
PENCIL BUTTON HOLSTER BLD 10FT (ELECTRODE) ×2 IMPLANT
SPONGE LAP 18X18 RF (DISPOSABLE) ×2 IMPLANT
STOCKINETTE 4X48 STRL (DRAPES) ×1 IMPLANT
STOCKINETTE 6  STRL (DRAPES) ×2
STOCKINETTE 6 STRL (DRAPES) IMPLANT
SUT ETHILON 2 0 FSLX (SUTURE) ×3 IMPLANT
SYR CONTROL 10ML LL (SYRINGE) ×2 IMPLANT
UNDERPAD 30X30 (UNDERPADS AND DIAPERS) ×1 IMPLANT
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
## 2019-03-22 NOTE — Anesthesia Postprocedure Evaluation (Signed)
Anesthesia Post Note  Patient: Jordan Reeves  Procedure(s) Performed: LEFT ACHILLES TENDON Z-LENGTHENING (Left Ankle)     Patient location during evaluation: PACU Anesthesia Type: General Level of consciousness: awake and alert Pain management: pain level controlled Vital Signs Assessment: post-procedure vital signs reviewed and stable Respiratory status: spontaneous breathing, nonlabored ventilation, respiratory function stable and patient connected to nasal cannula oxygen Cardiovascular status: blood pressure returned to baseline and stable Postop Assessment: no apparent nausea or vomiting Anesthetic complications: no    Last Vitals:  Vitals:   03/22/19 0800 03/22/19 0815  BP: 118/72 122/75  Pulse: 70 70  Resp: 12 15  Temp:    SpO2: 98% 97%    Last Pain:  Vitals:   03/22/19 0815  TempSrc:   PainSc: 0-No pain                 Squire Withey S

## 2019-03-22 NOTE — Anesthesia Procedure Notes (Signed)
Procedure Name: LMA Insertion Date/Time: 03/22/2019 7:28 AM Performed by: Lyndee Leo, CRNA Pre-anesthesia Checklist: Patient identified, Emergency Drugs available, Suction available and Patient being monitored Patient Re-evaluated:Patient Re-evaluated prior to induction Oxygen Delivery Method: Circle system utilized Preoxygenation: Pre-oxygenation with 100% oxygen Induction Type: IV induction Ventilation: Mask ventilation without difficulty LMA: LMA inserted LMA Size: 4.0 Number of attempts: 1 Airway Equipment and Method: Bite block Placement Confirmation: positive ETCO2 Tube secured with: Tape Dental Injury: Teeth and Oropharynx as per pre-operative assessment

## 2019-03-22 NOTE — Anesthesia Preprocedure Evaluation (Signed)
Anesthesia Evaluation  Patient identified by MRN, date of birth, ID band Patient awake    Reviewed: Allergy & Precautions, NPO status , Patient's Chart, lab work & pertinent test results  Airway Mallampati: II  TM Distance: >3 FB Neck ROM: Full    Dental no notable dental hx.    Pulmonary neg pulmonary ROS,    Pulmonary exam normal breath sounds clear to auscultation       Cardiovascular hypertension, negative cardio ROS Normal cardiovascular exam Rhythm:Regular Rate:Normal     Neuro/Psych negative neurological ROS  negative psych ROS   GI/Hepatic negative GI ROS, Neg liver ROS,   Endo/Other  diabetesHypothyroidism Morbid obesity  Renal/GU negative Renal ROS  negative genitourinary   Musculoskeletal negative musculoskeletal ROS (+)   Abdominal   Peds negative pediatric ROS (+)  Hematology negative hematology ROS (+)   Anesthesia Other Findings   Reproductive/Obstetrics negative OB ROS                             Anesthesia Physical Anesthesia Plan  ASA: III  Anesthesia Plan: General   Post-op Pain Management:  Regional for Post-op pain   Induction: Intravenous  PONV Risk Score and Plan: 3 and Ondansetron, Dexamethasone and Treatment may vary due to age or medical condition  Airway Management Planned: LMA  Additional Equipment:   Intra-op Plan:   Post-operative Plan: Extubation in OR  Informed Consent: I have reviewed the patients History and Physical, chart, labs and discussed the procedure including the risks, benefits and alternatives for the proposed anesthesia with the patient or authorized representative who has indicated his/her understanding and acceptance.     Dental advisory given  Plan Discussed with: CRNA and Surgeon  Anesthesia Plan Comments:         Anesthesia Quick Evaluation

## 2019-03-22 NOTE — Op Note (Signed)
03/22/2019  7:56 AM  PATIENT:  Jordan Reeves    PRE-OPERATIVE DIAGNOSIS:  Achilles Contracture  POST-OPERATIVE DIAGNOSIS:  Same  PROCEDURE:  LEFT ACHILLES TENDON Z-LENGTHENING  SURGEON:  Newt Minion, MD  PHYSICIAN ASSISTANT:None ANESTHESIA:   General  PREOPERATIVE INDICATIONS:  [name] is a  48 y.o. female with a diagnosis of Achilles Contracture who failed conservative measures and elected for surgical management.    The risks benefits and alternatives were discussed with the patient preoperatively including but not limited to the risks of infection, bleeding, nerve injury, cardiopulmonary complications, the need for revision surgery, among others, and the patient was willing to proceed.  OPERATIVE IMPLANTS: none  @ENCIMAGES @  OPERATIVE FINDINGS: Dorsiflexion improved from a equinus contracture of 30 degrees to dorsiflexion of 20 degrees.  OPERATIVE PROCEDURE: Patient brought the operating room and underwent a general anesthetic.  After adequate levels anesthesia were obtained patient's left lower extremity was prepped using DuraPrep draped into a sterile field a timeout was called.  A 64 Beaver blade was used to make 3 percutaneous incisions and the Achilles underwent Z-lengthening.  After lengthening the contracture went from a 30 degree equinus contracture to a 20 degrees of dorsiflexion.  The wounds were cleansed sterile dressing was applied place and was placed in a fracture boot extubated taken to PACU in stable condition.   DISCHARGE PLANNING:  Antibiotic duration: Preoperative antibiotics 2 g Kefzol  Weightbearing: Weightbearing as tolerated  Pain medication: None  Dressing care/ Wound VAC: Change dressing in 5 days  Ambulatory devices: Crutches  Discharge to: Home  Follow-up: In the office 1 week post operative.

## 2019-03-22 NOTE — Transfer of Care (Signed)
Immediate Anesthesia Transfer of Care Note  Patient: Jordan Reeves  Procedure(s) Performed: LEFT ACHILLES TENDON Z-LENGTHENING (Left Ankle)  Patient Location: PACU  Anesthesia Type:General  Level of Consciousness: awake, alert  and oriented  Airway & Oxygen Therapy: Patient Spontanous Breathing and Patient connected to nasal cannula oxygen  Post-op Assessment: Report given to RN and Post -op Vital signs reviewed and stable  Post vital signs: Reviewed and stable  Last Vitals:  Vitals Value Taken Time  BP    Temp    Pulse    Resp    SpO2      Last Pain:  Vitals:   03/22/19 0647  TempSrc: Oral  PainSc: 0-No pain         Complications: No apparent anesthesia complications

## 2019-03-22 NOTE — Discharge Instructions (Signed)

## 2019-03-23 ENCOUNTER — Encounter (HOSPITAL_BASED_OUTPATIENT_CLINIC_OR_DEPARTMENT_OTHER): Payer: Self-pay | Admitting: Orthopedic Surgery

## 2019-03-29 ENCOUNTER — Encounter: Payer: Self-pay | Admitting: Family

## 2019-03-29 ENCOUNTER — Ambulatory Visit (INDEPENDENT_AMBULATORY_CARE_PROVIDER_SITE_OTHER): Payer: BC Managed Care – PPO | Admitting: Family

## 2019-03-29 ENCOUNTER — Other Ambulatory Visit: Payer: Self-pay

## 2019-03-29 VITALS — Ht 68.0 in | Wt 252.7 lb

## 2019-03-29 DIAGNOSIS — M6702 Short Achilles tendon (acquired), left ankle: Secondary | ICD-10-CM

## 2019-03-29 NOTE — Progress Notes (Signed)
Post-Op Visit Note   Patient: Jordan Reeves           Date of Birth: 1970/10/10           MRN: [id number] Visit Date: 03/29/2019 PCP: Rory Percy, MD  Chief Complaint: No chief complaint on file.   HPI:  HPI The patient is a 48 year old woman seen today 1 week status post left achilles z lengthening.  She is very pleased with her progress happy to be able to move her foot more than she has been 20 years.  Minimal swelling no pain.  Ortho Exam Portals are clean and dry there is no surrounding erythema no drainage no sign of infection  Visit Diagnoses:  1. Achilles tendon contracture, left     Plan: May shower may get these areas wet keep them clean and dry throughout the day continue the fracture boot until next visit she will work on passive range of motion of her foot.  Follow-Up Instructions: Return in about 2 weeks (around 04/12/2019).   Imaging: No results found.  Orders:  No orders of the defined types were placed in this encounter.  No orders of the defined types were placed in this encounter.    PMFS History: Patient Active Problem List   Diagnosis Date Noted  . Achilles tendon contracture, left   . Chronic hypertension complicating or reason for care during pregnancy, third trimester 10/11/2017  . 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 Diabetes mellitus without complication ([place]) 26/33/3545  . AMA (advanced maternal age) multigravida 35+ 05/01/2017  . Tachycardia 05/01/2017  . Obesity (BMI 30-39.9) 02/05/2016  . Benign essential HTN 01/25/2015  . PVCs (premature ventricular contractions) 01/25/2015  . Heart palpitations 11/21/2014  . Thyroiditis    Past Medical History:  Diagnosis Date  . Benign essential HTN 01/25/2015  . Diabetes mellitus without complication ([place])    insuiln currently  . Dysrhythmia    PVCs last EKG NSR 09/2016  . Hypertension   . Hypothyroidism   . Infertility, female   . Obesity (BMI 30-39.9) 02/05/2016  .  PCOS (polycystic ovarian syndrome)   . PVCs (premature ventricular contractions) 01/25/2015  . Thyroiditis    Hashimoto's now with hypothyroidism    Family History  Problem Relation Age of Onset  . Heart disease Paternal Grandfather   . Lymphoma Paternal Grandmother   . Heart disease Maternal Grandmother   . Heart disease Maternal Grandfather   . Heart disease Father   . Diabetes Mother   . Hypertension Mother   . Heart disease Mother        heart murmu  . Hyperlipidemia Mother   . Breast cancer Maternal Aunt   . Cervical cancer Maternal Aunt     Past Surgical History:  Procedure Laterality Date  . ACHILLES TENDON LENGTHENING Left 03/22/2019   Procedure: LEFT ACHILLES TENDON Z-LENGTHENING;  Surgeon: Newt Minion, MD;  Location: [facility];  Service: Orthopedics;  Laterality: Left;  . AMPUTATION Left 11/10/2018   Procedure: AMPUTATION TOE 4TH;  Surgeon: Tyson Babinski, DPM;  Location[facility];  Service: Podiatry;  Laterality: Left;  . CESAREAN SECTION WITH BILATERAL TUBAL LIGATION N/A 10/12/2017   Procedure: CESAREAN SECTION WITH BILATERAL TUBAL LIGATION;  Surgeon: Delsa Bern, MD;  Location: [facility];  Service: Obstetrics;  Laterality: N/A;  . TONSILLECTOMY AND ADENOIDECTOMY    . TUBAL LIGATION    . WISDOM TOOTH EXTRACTION     Social History   Occupational  History  . Not on file  Tobacco Use  . Smoking status: Never Smoker  . Smokeless tobacco: Never Used  Substance and Sexual Activity  . Alcohol use: No    Alcohol/week: 0.0 standard drinks  . Drug use: No  . Sexual activity: Yes    Birth control/protection: Surgical

## 2019-04-12 ENCOUNTER — Ambulatory Visit (INDEPENDENT_AMBULATORY_CARE_PROVIDER_SITE_OTHER): Payer: BC Managed Care – PPO | Admitting: Orthopedic Surgery

## 2019-04-12 ENCOUNTER — Encounter: Payer: Self-pay | Admitting: Orthopedic Surgery

## 2019-04-12 ENCOUNTER — Ambulatory Visit: Payer: BC Managed Care – PPO | Admitting: Family

## 2019-04-12 VITALS — Ht 68.0 in | Wt 252.0 lb

## 2019-04-12 DIAGNOSIS — M6701 Short Achilles tendon (acquired), right ankle: Secondary | ICD-10-CM

## 2019-04-12 DIAGNOSIS — M6702 Short Achilles tendon (acquired), left ankle: Secondary | ICD-10-CM

## 2019-04-12 NOTE — Progress Notes (Signed)
Office Visit Note   Patient: Jordan Reeves           Date of Birth: 1971-04-03           MRN: [id number] Visit Date: 04/12/2019              Requested by: Rory Percy, MD [place],  [place] [address] PCP: Rory Percy, MD  Chief Complaint  Patient presents with  . Left Achilles Tendon - Follow-up    03/22/19 left achilles  Z lengthening.       HPI: Patient is a 48 year old woman who presents 3 weeks status post left Achilles tendon Z-lengthening.  She states she is comfortable with her left foot in the fracture boot she states she can now feel her heel.  Patient complains of increasing pain across the right forefoot and is concerned ulceration and cellulitis.  Assessment & Plan: Visit Diagnoses:  1. Achilles tendon contracture, left   2. Achilles tendon contracture, right     Plan: We will have patient wean out of the fracture boot on the left foot and reevaluate in 4 weeks.  At that time anticipate we will need to set her up for Achilles Z-lengthening for the right foot.  Follow-Up Instructions: Return in about 4 weeks (around 05/10/2019).   Ortho Exam  Patient is alert, oriented, no adenopathy, well-dressed, normal affect, normal respiratory effort. Examination patient has a good dorsalis pedis pulse bilaterally.  Examination of the left foot she now has 20 degrees of dorsiflexion of the left there is no ulcers across the forefoot.  Semination the right foot she has a fixed 20 degrees equinus contracture on the right with callus across the right forefoot with some pressure areas between the toes but no full-thickness skin defect.  Imaging: No results found. No images are attached to the encounter.  Labs: Lab Results  Component Value Date   HGBA1C 6.7 (H) 11/08/2018   ESRSEDRATE 20 04/11/2018   REPTSTATUS 11/15/2018 FINAL 11/10/2018   GRAMSTAIN  11/10/2018    FEW WBC PRESENT, PREDOMINANTLY MONONUCLEAR NO ORGANISMS SEEN    CULT  11/10/2018    RARE  PSEUDOMONAS AERUGINOSA NO ANAEROBES ISOLATED Performed at [facility], [place] [address], [place], [place] [address]    [place] 11/10/2018     Lab Results  Component Value Date   ALBUMIN 4.2 04/11/2018   ALBUMIN 4.2 12/29/2017   ALBUMIN 2.5 (L) 10/13/2017    No results found for: MG No results found for: VD25OH  No results found for: PREALBUMIN CBC EXTENDED Latest Ref Rng & Units 04/11/2018 12/29/2017 10/13/2017  WBC 4.0 - 10.5 K/uL 12.0(H) 8.6 18.0(H)  RBC 3.87 - 5.11 MIL/uL 4.45 4.62 3.50(L)  HGB 12.0 - 15.0 g/dL 13.3 13.6 10.6(L)  HCT 36.0 - 46.0 % 39.1 39.9 30.3(L)  PLT 150 - 400 K/uL 286 279 228  NEUTROABS 1.7 - 7.7 K/uL 8.7(H) 5.9 -  LYMPHSABS 0.7 - 4.0 K/uL 2.1 1.9 -     Body mass index is 38.32 kg/m.  Orders:  No orders of the defined types were placed in this encounter.  No orders of the defined types were placed in this encounter.    Procedures: No procedures performed  Clinical Data: No additional findings.  ROS:  All other systems negative, except as noted in the HPI. Review of Systems  Objective: Vital Signs: Ht 5\' 8"  (1.727 m)   Wt 252 lb (114.3 kg)   BMI 38.32 kg/m  Specialty Comments:  No specialty comments available.  PMFS History: Patient Active Problem List   Diagnosis Date Noted  . Achilles tendon contracture, left   . Chronic hypertension complicating or reason for care during pregnancy, third trimester 10/11/2017  . 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 Diabetes mellitus without complication ([place]) 03/83/3383  . AMA (advanced maternal age) multigravida 35+ 05/01/2017  . Tachycardia 05/01/2017  . Obesity (BMI 30-39.9) 02/05/2016  . Benign essential HTN 01/25/2015  . PVCs (premature ventricular contractions) 01/25/2015  . Heart palpitations 11/21/2014  . Thyroiditis    Past Medical History:  Diagnosis Date  . Benign essential HTN 01/25/2015  . Diabetes mellitus without complication  ([place])    insuiln currently  . Dysrhythmia    PVCs last EKG NSR 09/2016  . Hypertension   . Hypothyroidism   . Infertility, female   . Obesity (BMI 30-39.9) 02/05/2016  . PCOS (polycystic ovarian syndrome)   . PVCs (premature ventricular contractions) 01/25/2015  . Thyroiditis    Hashimoto's now with hypothyroidism    Family History  Problem Relation Age of Onset  . Heart disease Paternal Grandfather   . Lymphoma Paternal Grandmother   . Heart disease Maternal Grandmother   . Heart disease Maternal Grandfather   . Heart disease Father   . Diabetes Mother   . Hypertension Mother   . Heart disease Mother        heart murmu  . Hyperlipidemia Mother   . Breast cancer Maternal Aunt   . Cervical cancer Maternal Aunt     Past Surgical History:  Procedure Laterality Date  . ACHILLES TENDON LENGTHENING Left 03/22/2019   Procedure: LEFT ACHILLES TENDON Z-LENGTHENING;  Surgeon: Newt Minion, MD;  Location: [facility];  Service: Orthopedics;  Laterality: Left;  . AMPUTATION Left 11/10/2018   Procedure: AMPUTATION TOE 4TH;  Surgeon: Tyson Babinski, DPM;  Location[facility];  Service: Podiatry;  Laterality: Left;  . CESAREAN SECTION WITH BILATERAL TUBAL LIGATION N/A 10/12/2017   Procedure: CESAREAN SECTION WITH BILATERAL TUBAL LIGATION;  Surgeon: Delsa Bern, MD;  Location: [facility];  Service: Obstetrics;  Laterality: N/A;  . TONSILLECTOMY AND ADENOIDECTOMY    . TUBAL LIGATION    . WISDOM TOOTH EXTRACTION     Social History   Occupational History  . Not on file  Tobacco Use  . Smoking status: Never Smoker  . Smokeless tobacco: Never Used  Substance and Sexual Activity  . Alcohol use: No    Alcohol/week: 0.0 standard drinks  . Drug use: No  . Sexual activity: Yes    Birth control/protection: Surgical

## 2019-05-10 ENCOUNTER — Ambulatory Visit (INDEPENDENT_AMBULATORY_CARE_PROVIDER_SITE_OTHER): Payer: BC Managed Care – PPO | Admitting: Orthopedic Surgery

## 2019-05-10 ENCOUNTER — Encounter: Payer: Self-pay | Admitting: Orthopedic Surgery

## 2019-05-10 VITALS — Ht 68.0 in | Wt 252.0 lb

## 2019-05-10 DIAGNOSIS — M6702 Short Achilles tendon (acquired), left ankle: Secondary | ICD-10-CM

## 2019-05-10 DIAGNOSIS — M6701 Short Achilles tendon (acquired), right ankle: Secondary | ICD-10-CM

## 2019-05-11 ENCOUNTER — Encounter: Payer: Self-pay | Admitting: Orthopedic Surgery

## 2019-05-11 NOTE — Progress Notes (Signed)
Office Visit Note   Patient: Jordan Reeves           Date of Birth: 16-Mar-1971           MRN: [id number] Visit Date: 05/10/2019              Requested by: Rory Percy, MD [place],  [place] [address] PCP: Rory Percy, MD  Chief Complaint  Patient presents with  . Left Foot - Routine Post Op    03/22/19 left achilles Z lengthening       HPI: Patient is a 48 year old woman who presents in follow-up status post left Achilles Z-lengthening.  She states she still has some tightness in the right Achilles and she is having some crossing over the second toe over the third toe on the right foot.  Assessment & Plan: Visit Diagnoses:  1. Achilles tendon contracture, left   2. Achilles tendon contracture, right     Plan: Recommended continue with heel cord stretching bilaterally plan to follow-up in the office in 4 weeks.  Patient states she would like to consider a Z-lengthening from the Achilles on the right foot and possible Weil osteotomy for the clawing of the second and third toes.  And possible claw toe surgery for the second and third toes.  Follow-Up Instructions: Return in about 4 weeks (around 06/07/2019).   Ortho Exam  Patient is alert, oriented, no adenopathy, well-dressed, normal affect, normal respiratory effort. Examination patient has dorsiflexion of 20 degrees in the left and 0 on the right her surgical incisions are well-healed she has overlapping of the second toe over the third toe on the right foot a spacer was placed to keep the toes apart.  Imaging: No results found. No images are attached to the encounter.  Labs: Lab Results  Component Value Date   HGBA1C 6.7 (H) 11/08/2018   ESRSEDRATE 20 04/11/2018   REPTSTATUS 11/15/2018 FINAL 11/10/2018   GRAMSTAIN  11/10/2018    FEW WBC PRESENT, PREDOMINANTLY MONONUCLEAR NO ORGANISMS SEEN    CULT  11/10/2018    RARE PSEUDOMONAS AERUGINOSA NO ANAEROBES ISOLATED Performed at [facility],  [place] [address] [place], [place] [address]    [place] 11/10/2018     Lab Results  Component Value Date   ALBUMIN 4.2 04/11/2018   ALBUMIN 4.2 12/29/2017   ALBUMIN 2.5 (L) 10/13/2017    No results found for: MG No results found for: VD25OH  No results found for: PREALBUMIN CBC EXTENDED Latest Ref Rng & Units 04/11/2018 12/29/2017 10/13/2017  WBC 4.0 - 10.5 K/uL 12.0(H) 8.6 18.0(H)  RBC 3.87 - 5.11 MIL/uL 4.45 4.62 3.50(L)  HGB 12.0 - 15.0 g/dL 13.3 13.6 10.6(L)  HCT 36.0 - 46.0 % 39.1 39.9 30.3(L)  PLT 150 - 400 K/uL 286 279 228  NEUTROABS 1.7 - 7.7 K/uL 8.7(H) 5.9 -  LYMPHSABS 0.7 - 4.0 K/uL 2.1 1.9 -     Body mass index is 38.32 kg/m.  Orders:  No orders of the defined types were placed in this encounter.  No orders of the defined types were placed in this encounter.    Procedures: No procedures performed  Clinical Data: No additional findings.  ROS:  All other systems negative, except as noted in the HPI. Review of Systems  Objective: Vital Signs: Ht 5\' 8"  (1.727 m)   Wt 252 lb (114.3 kg)   BMI 38.32 kg/m   Specialty Comments:  No specialty comments available.  [place]  History: Patient Active Problem List   Diagnosis Date Noted  . Achilles tendon contracture, left   . Chronic hypertension complicating or reason for care during pregnancy, third trimester 10/11/2017  . 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 Diabetes mellitus without complication ([place]) 99991111  . AMA (advanced maternal age) multigravida 35+ 05/01/2017  . Tachycardia 05/01/2017  . Obesity (BMI 30-39.9) 02/05/2016  . Benign essential HTN 01/25/2015  . PVCs (premature ventricular contractions) 01/25/2015  . Heart palpitations 11/21/2014  . Thyroiditis    Past Medical History:  Diagnosis Date  . Benign essential HTN 01/25/2015  . Diabetes mellitus without complication ([place])    insuiln currently  . Dysrhythmia    PVCs last EKG NSR 09/2016  .  Hypertension   . Hypothyroidism   . Infertility, female   . Obesity (BMI 30-39.9) 02/05/2016  . PCOS (polycystic ovarian syndrome)   . PVCs (premature ventricular contractions) 01/25/2015  . Thyroiditis    Hashimoto's now with hypothyroidism    Family History  Problem Relation Age of Onset  . Heart disease Paternal Grandfather   . Lymphoma Paternal Grandmother   . Heart disease Maternal Grandmother   . Heart disease Maternal Grandfather   . Heart disease Father   . Diabetes Mother   . Hypertension Mother   . Heart disease Mother        heart murmu  . Hyperlipidemia Mother   . Breast cancer Maternal Aunt   . Cervical cancer Maternal Aunt     Past Surgical History:  Procedure Laterality Date  . ACHILLES TENDON LENGTHENING Left 03/22/2019   Procedure: LEFT ACHILLES TENDON Z-LENGTHENING;  Surgeon: Newt Minion, MD;  Location: [facility];  Service: Orthopedics;  Laterality: Left;  . AMPUTATION Left 11/10/2018   Procedure: AMPUTATION TOE 4TH;  Surgeon: Tyson Babinski, DPM;  [facility];  Service: Podiatry;  Laterality: Left;  . CESAREAN SECTION WITH BILATERAL TUBAL LIGATION N/A 10/12/2017   Procedure: CESAREAN SECTION WITH BILATERAL TUBAL LIGATION;  Surgeon: Delsa Bern, MD;  Location: [facility];  Service: Obstetrics;  Laterality: N/A;  . TONSILLECTOMY AND ADENOIDECTOMY    . TUBAL LIGATION    . WISDOM TOOTH EXTRACTION     Social History   Occupational History  . Not on file  Tobacco Use  . Smoking status: Never Smoker  . Smokeless tobacco: Never Used  Substance and Sexual Activity  . Alcohol use: No    Alcohol/week: 0.0 standard drinks  . Drug use: No  . Sexual activity: Yes    Birth control/protection: Surgical

## 2019-06-07 ENCOUNTER — Ambulatory Visit: Payer: Self-pay

## 2019-06-07 ENCOUNTER — Other Ambulatory Visit: Payer: Self-pay

## 2019-06-07 ENCOUNTER — Encounter: Payer: Self-pay | Admitting: Orthopedic Surgery

## 2019-06-07 ENCOUNTER — Ambulatory Visit (INDEPENDENT_AMBULATORY_CARE_PROVIDER_SITE_OTHER): Payer: BC Managed Care – PPO | Admitting: Orthopedic Surgery

## 2019-06-07 VITALS — Ht 68.0 in | Wt 252.0 lb

## 2019-06-07 DIAGNOSIS — M2041 Other hammer toe(s) (acquired), right foot: Secondary | ICD-10-CM | POA: Diagnosis not present

## 2019-06-07 DIAGNOSIS — M79671 Pain in right foot: Secondary | ICD-10-CM

## 2019-06-07 DIAGNOSIS — M6702 Short Achilles tendon (acquired), left ankle: Secondary | ICD-10-CM | POA: Diagnosis not present

## 2019-06-07 DIAGNOSIS — M79672 Pain in left foot: Secondary | ICD-10-CM

## 2019-06-07 DIAGNOSIS — M2042 Other hammer toe(s) (acquired), left foot: Secondary | ICD-10-CM

## 2019-06-07 DIAGNOSIS — M6701 Short Achilles tendon (acquired), right ankle: Secondary | ICD-10-CM

## 2019-06-08 ENCOUNTER — Encounter: Payer: Self-pay | Admitting: Orthopedic Surgery

## 2019-06-08 NOTE — Progress Notes (Signed)
Office Visit Note   Patient: Jordan Reeves           Date of Birth: 04/25/1971           MRN: [id number] Visit Date: 06/07/2019              Requested by: Rory Percy, MD [place],  [place] [address] PCP: Rory Percy, MD  Chief Complaint  Patient presents with  . Right Foot - Follow-up, Pain  . Left Foot - Follow-up, Pain      HPI: Patient is a 48 year old woman who presents in follow-up status post left Achilles Z-lengthening. Is quite pleased with her progress overall. She does still have some tightness on the left but feels things are improving overall. Has been working on heel cord stretching as much as she can. Did also recently begin wearing [employer] walking shoes. Feels the combination of shoewear and stretching are really helping. States gait is improved. Does have some concern for the clawing of the second and third toes on the left, pain over dorsum of PIP in shoewear. No ulcers.  Also complains of pain in the right Achilles with tightness. She is having some crossing over the second toe over the third toe on the right foot which is mildly painful and distressing. Would like to consider surgical intervention for toes and lengthening of achilles. She is currently working on bringing her A1C back down under better control.  Ultimately would like to hold on surgery until A1C is back down. Feels comfortable continuing conservative measures at this time.  Assessment & Plan: Visit Diagnoses:  1. Achilles tendon contracture, left   2. Pain in both feet   3. Achilles tendon contracture, right   4. Hammer toes, bilateral     Plan: Recommended continue with heel cord stretching bilaterally plan to follow-up in the office in 2 months after next lab work with PCP/endocrine.    Patient states she would like to consider a Z-lengthening from the Achilles on the right foot and possible Weil osteotomy for the clawing of the second and third toes.  And possible claw toe surgery  for the second and third toes.  Follow-Up Instructions: Return in about 4 weeks (around 07/05/2019).   Ortho Exam  Patient is alert, oriented, no adenopathy, well-dressed, normal affect, normal respiratory effort. Examination patient has dorsiflexion of 20 degrees in the left and 0 on the right her surgical incisions are well-healed. she has overlapping of the second toe over the third toe on the right foot. Fixed clawing of 3rd and 4th toes on left foot. 2nd toe on left is surgically absent.   Imaging: No results found. No images are attached to the encounter.  Labs: Lab Results  Component Value Date   HGBA1C 6.7 (H) 11/08/2018   ESRSEDRATE 20 04/11/2018   REPTSTATUS 11/15/2018 FINAL 11/10/2018   GRAMSTAIN  11/10/2018    FEW WBC PRESENT, PREDOMINANTLY MONONUCLEAR NO ORGANISMS SEEN    CULT  11/10/2018    RARE PSEUDOMONAS AERUGINOSA NO ANAEROBES ISOLATED Performed at [facility], [place] [address] [place],  [address]    [place] 11/10/2018     Lab Results  Component Value Date   ALBUMIN 4.2 04/11/2018   ALBUMIN 4.2 12/29/2017   ALBUMIN 2.5 (L) 10/13/2017    No results found for: MG No results found for: VD25OH  No results found for: PREALBUMIN CBC EXTENDED Latest Ref Rng & Units 04/11/2018 12/29/2017 10/13/2017  WBC  4.0 - 10.5 K/uL 12.0(H) 8.6 18.0(H)  RBC 3.87 - 5.11 MIL/uL 4.45 4.62 3.50(L)  HGB 12.0 - 15.0 g/dL 13.3 13.6 10.6(L)  HCT 36.0 - 46.0 % 39.1 39.9 30.3(L)  PLT 150 - 400 K/uL 286 279 228  NEUTROABS 1.7 - 7.7 K/uL 8.7(H) 5.9 -  LYMPHSABS 0.7 - 4.0 K/uL 2.1 1.9 -     Body mass index is 38.32 kg/m.  Orders:  Orders Placed This Encounter  Procedures  . XR Foot 2 Views Left  . XR Foot 2 Views Right   No orders of the defined types were placed in this encounter.    Procedures: No procedures performed  Clinical Data: No additional findings.  ROS:  All other systems negative, except as noted in the HPI.  Review of Systems  Constitutional: Negative for chills and fever.  Musculoskeletal: Positive for arthralgias, gait problem and myalgias.    Objective: Vital Signs: Ht 5\' 8"  (1.727 m)   Wt 252 lb (114.3 kg)   BMI 38.32 kg/m   Specialty Comments:  No specialty comments available.  PMFS History: Patient Active Problem List   Diagnosis Date Noted  . Achilles tendon contracture, left   . Chronic hypertension complicating or reason for care during pregnancy, third trimester 10/11/2017  . 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 Diabetes mellitus without complication ([place]) 99991111  . AMA (advanced maternal age) multigravida 35+ 05/01/2017  . Tachycardia 05/01/2017  . Obesity (BMI 30-39.9) 02/05/2016  . Benign essential HTN 01/25/2015  . PVCs (premature ventricular contractions) 01/25/2015  . Heart palpitations 11/21/2014  . Thyroiditis    Past Medical History:  Diagnosis Date  . Benign essential HTN 01/25/2015  . Diabetes mellitus without complication ([place])    insuiln currently  . Dysrhythmia    PVCs last EKG NSR 09/2016  . Hypertension   . Hypothyroidism   . Infertility, female   . Obesity (BMI 30-39.9) 02/05/2016  . PCOS (polycystic ovarian syndrome)   . PVCs (premature ventricular contractions) 01/25/2015  . Thyroiditis    Hashimoto's now with hypothyroidism    Family History  Problem Relation Age of Onset  . Heart disease Paternal Grandfather   . Lymphoma Paternal Grandmother   . Heart disease Maternal Grandmother   . Heart disease Maternal Grandfather   . Heart disease Father   . Diabetes Mother   . Hypertension Mother   . Heart disease Mother        heart murmu  . Hyperlipidemia Mother   . Breast cancer Maternal Aunt   . Cervical cancer Maternal Aunt     Past Surgical History:  Procedure Laterality Date  . ACHILLES TENDON LENGTHENING Left 03/22/2019   Procedure: LEFT ACHILLES TENDON Z-LENGTHENING;  Surgeon: Newt Minion, MD;  Location: [facility];  Service: Orthopedics;  Laterality: Left;  . AMPUTATION Left 11/10/2018   Procedure: AMPUTATION TOE 4TH;  Surgeon: Tyson Babinski, DPM;  [facility];  Service: Podiatry;  Laterality: Left;  . CESAREAN SECTION WITH BILATERAL TUBAL LIGATION N/A 10/12/2017   Procedure: CESAREAN SECTION WITH BILATERAL TUBAL LIGATION;  Surgeon: Delsa Bern, MD;  Location: [facility];  Service: Obstetrics;  Laterality: N/A;  . TONSILLECTOMY AND ADENOIDECTOMY    . TUBAL LIGATION    . WISDOM TOOTH EXTRACTION     Social History   Occupational History  . Not on file  Tobacco Use  . Smoking status: Never Smoker  . Smokeless tobacco: Never Used  Substance and Sexual Activity  .  Alcohol use: No    Alcohol/week: 0.0 standard drinks  . Drug use: No  . Sexual activity: Yes    Birth control/protection: Surgical

## 2019-08-09 ENCOUNTER — Ambulatory Visit: Payer: BC Managed Care – PPO | Admitting: Orthopedic Surgery

## 2020-08-15 ENCOUNTER — Ambulatory Visit: Payer: Self-pay

## 2020-08-15 ENCOUNTER — Other Ambulatory Visit: Payer: Self-pay

## 2020-08-15 ENCOUNTER — Encounter: Payer: Self-pay | Admitting: Physician Assistant

## 2020-08-15 ENCOUNTER — Ambulatory Visit: Payer: BC Managed Care – PPO | Admitting: Physician Assistant

## 2020-08-15 DIAGNOSIS — M79672 Pain in left foot: Secondary | ICD-10-CM

## 2020-08-15 NOTE — Progress Notes (Signed)
Office Visit Note   Patient: Jordan Reeves           Date of Birth: [birth year]           MRN: [id number] Visit Date: 08/15/2020              Requested by: Rory Percy, MD [place],  [place] [address] PCP: Rory Percy, MD  Chief Complaint  Patient presents with  . Left Foot - Pain      HPI: Patient is a pleasant 49 year old woman with a chief complaint of left second and third clawing of her toes.  Some rubbing in her shoe.  She is status post an Achilles tendon lengthening about a year and a half ago with Dr. Sharol Given.  He was helpful at that time that would help with some of her issues with her forefoot.  She continues to have clawing and says she cannot find shoe wear.  She has tried [employer].  She was started on cephalexin for concern of some ulcers on the toes over the weekend.  She would like an x-ray today to make sure that she has had no bony infiltration of the infection  Assessment & Plan: Visit Diagnoses:  1. Pain in left foot     Plan: Patient and I discussed getting shoes with more of a square toe box.  She said these are difficult to find.  We talked about different brand she could try.  If she wants to discuss surgery we will have her follow-up with Dr. Sharol Given  Follow-Up Instructions: No follow-ups on file.   Ortho Exam  Patient is alert, oriented, no adenopathy, well-dressed, normal affect, normal respiratory effort. Examination she has palpable dorsalis pedis pulses she has second and third clawing of the toes she does have some skin breakdown ulcers at the PIP joints.  There is no surrounding cellulitis these do not probe to bone there is no drainage there is no sausage toes or cellulitis no signs of acute infection  Imaging: XR Foot 2 Views Left  Result Date: 08/15/2020 X-rays of her foot demonstrate clawing of toes.  There is no evidence of any bony destruction.  No images are attached to the encounter.  Labs: Lab Results  Component Value Date    HGBA1C 6.7 (H) 11/08/2018   ESRSEDRATE 20 04/11/2018   REPTSTATUS 11/15/2018 FINAL 11/10/2018   GRAMSTAIN  11/10/2018    FEW WBC PRESENT, PREDOMINANTLY MONONUCLEAR NO ORGANISMS SEEN    CULT  11/10/2018    RARE PSEUDOMONAS AERUGINOSA NO ANAEROBES ISOLATED Performed at [facility], [place] [address], [place], [place] [address]    [place] 11/10/2018     Lab Results  Component Value Date   ALBUMIN 4.2 04/11/2018   ALBUMIN 4.2 12/29/2017   ALBUMIN 2.5 (L) 10/13/2017    No results found for: MG No results found for: VD25OH  No results found for: PREALBUMIN CBC EXTENDED Latest Ref Rng & Units 04/11/2018 12/29/2017 10/13/2017  WBC 4.0 - 10.5 K/uL 12.0(H) 8.6 18.0(H)  RBC 3.87 - 5.11 MIL/uL 4.45 4.62 3.50(L)  HGB 12.0 - 15.0 g/dL 13.3 13.6 10.6(L)  HCT 36.0 - 46.0 % 39.1 39.9 30.3(L)  PLT 150 - 400 K/uL 286 279 228  NEUTROABS 1.7 - 7.7 K/uL 8.7(H) 5.9 -  LYMPHSABS 0.7 - 4.0 K/uL 2.1 1.9 -     There is no height or weight on file to calculate BMI.  Orders:  Orders Placed This Encounter  Procedures  . XR Foot 2 Views Left   No orders of the defined types were placed in this encounter.    Procedures: No procedures performed  Clinical Data: No additional findings.  ROS:  All other systems negative, except as noted in the HPI. Review of Systems  Objective: Vital Signs: There were no vitals taken for this visit.  Specialty Comments:  No specialty comments available.  PMFS History: Patient Active Problem List   Diagnosis Date Noted  . Achilles tendon contracture, left   . Chronic hypertension complicating or reason for care during pregnancy, third trimester 10/11/2017  . 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 Diabetes mellitus without complication ([place]) 99991111  . AMA (advanced maternal age) multigravida 35+ 05/01/2017  . Tachycardia 05/01/2017  . Obesity (BMI 30-39.9) 02/05/2016  . Benign essential HTN 01/25/2015  .  PVCs (premature ventricular contractions) 01/25/2015  . Heart palpitations 11/21/2014  . Thyroiditis    Past Medical History:  Diagnosis Date  . Benign essential HTN 01/25/2015  . Diabetes mellitus without complication ([place])    insuiln currently  . Dysrhythmia    PVCs last EKG NSR 09/2016  . Hypertension   . Hypothyroidism   . Infertility, female   . Obesity (BMI 30-39.9) 02/05/2016  . PCOS (polycystic ovarian syndrome)   . PVCs (premature ventricular contractions) 01/25/2015  . Thyroiditis    Hashimoto's now with hypothyroidism    Family History  Problem Relation Age of Onset  . Heart disease Paternal Grandfather   . Lymphoma Paternal Grandmother   . Heart disease Maternal Grandmother   . Heart disease Maternal Grandfather   . Heart disease Father   . Diabetes Mother   . Hypertension Mother   . Heart disease Mother        heart murmu  . Hyperlipidemia Mother   . Breast cancer Maternal Aunt   . Cervical cancer Maternal Aunt     Past Surgical History:  Procedure Laterality Date  . ACHILLES TENDON LENGTHENING Left 03/22/2019   Procedure: LEFT ACHILLES TENDON Z-LENGTHENING;  Surgeon: Newt Minion, MD;  Location: [facility];  Service: Orthopedics;  Laterality: Left;  . AMPUTATION Left 11/10/2018   Procedure: AMPUTATION TOE 4TH;  Surgeon: Tyson Babinski, DPM;  Location: [facility];  Service: Podiatry;  Laterality: Left;  . CESAREAN SECTION WITH BILATERAL TUBAL LIGATION N/A 10/12/2017   Procedure: CESAREAN SECTION WITH BILATERAL TUBAL LIGATION;  Surgeon: Delsa Bern, MD;  Location: [facility];  Service: Obstetrics;  Laterality: N/A;  . TONSILLECTOMY AND ADENOIDECTOMY    . TUBAL LIGATION    . WISDOM TOOTH EXTRACTION     Social History   Occupational History  . Not on file  Tobacco Use  . Smoking status: Never Smoker  . Smokeless tobacco: Never Used  Vaping Use  . Vaping Use: Never used  Substance and Sexual Activity  . Alcohol use: No     Alcohol/week: 0.0 standard drinks  . Drug use: No  . Sexual activity: Yes    Birth control/protection: Surgical

## 2020-09-04 ENCOUNTER — Ambulatory Visit: Payer: BC Managed Care – PPO | Admitting: Orthopedic Surgery

## 2020-09-04 DIAGNOSIS — M205X2 Other deformities of toe(s) (acquired), left foot: Secondary | ICD-10-CM

## 2020-09-05 ENCOUNTER — Encounter: Payer: Self-pay | Admitting: Orthopedic Surgery

## 2020-09-05 NOTE — Progress Notes (Signed)
Office Visit Note   Patient: Jordan Reeves           Date of Birth: Jun 23, 1971           MRN: [id number] Visit Date: 09/04/2020              Requested by: Rory Percy, MD [place],  [place] [address] PCP: Rory Percy, MD  Chief Complaint  Patient presents with  . Left Foot - Follow-up      HPI: Patient is an 50 year old woman who presents complaining of increasing pain and ulceration from the fixed clawing of the toes left worse than right foot.  Patient states she has had multiple ulcers over the PIP joint of the second and third toes left foot.  Assessment & Plan: Visit Diagnoses:  1. Claw toe, acquired, left     Plan: With patient's history of ulceration and risk of infection discussed that a surgical option is to proceed with a Weil osteotomy for the second and third metatarsals PIP resection and pinning of the second and third toes.  Risk and benefits were discussed including risk of the wound not healing need for additional surgery.  Patient states she understands wished to proceed at this time.  Also recommended wearing knee-high compression stockings for her venous insufficiency.  Follow-Up Instructions: Return if symptoms worsen or fail to improve, for Follow-up 1 week after proposed surgery.   Ortho Exam  Patient is alert, oriented, no adenopathy, well-dressed, normal affect, normal respiratory effort. Examination patient has a palpable dorsalis pedis pulse bilaterally.  She does have Achilles contracture with dorsiflexion only to neutral bilaterally.  There is fixed clawing of the lesser toes on both the left and the right foot but worse on the left foot with healed ulcers over the PIP joint.  These toes cannot be passively corrected.  Review of her radiographs shows a previous fourth toe amputation with a long second and third metatarsal and subluxation of the MPT joint of the second and third toes.  Patient has venous swelling in both legs calf  measures 39 cm in circumference.  Imaging: No results found. No images are attached to the encounter.  Labs: Lab Results  Component Value Date   HGBA1C 6.7 (H) 11/08/2018   ESRSEDRATE 20 04/11/2018   REPTSTATUS 11/15/2018 FINAL 11/10/2018   GRAMSTAIN  11/10/2018    FEW WBC PRESENT, PREDOMINANTLY MONONUCLEAR NO ORGANISMS SEEN    CULT  11/10/2018    RARE PSEUDOMONAS AERUGINOSA NO ANAEROBES ISOLATED Performed at [facility], [place] [address], [place], [place] [address]    [place] 11/10/2018     Lab Results  Component Value Date   ALBUMIN 4.2 04/11/2018   ALBUMIN 4.2 12/29/2017   ALBUMIN 2.5 (L) 10/13/2017    No results found for: MG No results found for: VD25OH  No results found for: PREALBUMIN CBC EXTENDED Latest Ref Rng & Units 04/11/2018 12/29/2017 10/13/2017  WBC 4.0 - 10.5 K/uL 12.0(H) 8.6 18.0(H)  RBC 3.87 - 5.11 MIL/uL 4.45 4.62 3.50(L)  HGB 12.0 - 15.0 g/dL 13.3 13.6 10.6(L)  HCT 36.0 - 46.0 % 39.1 39.9 30.3(L)  PLT 150 - 400 K/uL 286 279 228  NEUTROABS 1.7 - 7.7 K/uL 8.7(H) 5.9 -  LYMPHSABS 0.7 - 4.0 K/uL 2.1 1.9 -     There is no height or weight on file to calculate BMI.  Orders:  No orders of the defined types were placed in this encounter.  No orders of the defined types were placed in this encounter.    Procedures: No procedures performed  Clinical Data: No additional findings.  ROS:  All other systems negative, except as noted in the HPI. Review of Systems  Objective: Vital Signs: There were no vitals taken for this visit.  Specialty Comments:  No specialty comments available.  PMFS History: Patient Active Problem List   Diagnosis Date Noted  . Achilles tendon contracture, left   . Chronic hypertension complicating or reason for care during pregnancy, third trimester 10/11/2017  . 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 Diabetes mellitus without complication ([place]) 16/96/7893  . AMA  (advanced maternal age) multigravida 35+ 05/01/2017  . Tachycardia 05/01/2017  . Obesity (BMI 30-39.9) 02/05/2016  . Benign essential HTN 01/25/2015  . PVCs (premature ventricular contractions) 01/25/2015  . Heart palpitations 11/21/2014  . Thyroiditis    Past Medical History:  Diagnosis Date  . Benign essential HTN 01/25/2015  . Diabetes mellitus without complication ([place])    insuiln currently  . Dysrhythmia    PVCs last EKG NSR 09/2016  . Hypertension   . Hypothyroidism   . Infertility, female   . Obesity (BMI 30-39.9) 02/05/2016  . PCOS (polycystic ovarian syndrome)   . PVCs (premature ventricular contractions) 01/25/2015  . Thyroiditis    Hashimoto's now with hypothyroidism    Family History  Problem Relation Age of Onset  . Heart disease Paternal Grandfather   . Lymphoma Paternal Grandmother   . Heart disease Maternal Grandmother   . Heart disease Maternal Grandfather   . Heart disease Father   . Diabetes Mother   . Hypertension Mother   . Heart disease Mother        heart murmu  . Hyperlipidemia Mother   . Breast cancer Maternal Aunt   . Cervical cancer Maternal Aunt     Past Surgical History:  Procedure Laterality Date  . ACHILLES TENDON LENGTHENING Left 03/22/2019   Procedure: LEFT ACHILLES TENDON Z-LENGTHENING;  Surgeon: Newt Minion, MD;  Location: [facility];  Service: Orthopedics;  Laterality: Left;  . AMPUTATION Left 11/10/2018   Procedure: AMPUTATION TOE 4TH;  Surgeon: Tyson Babinski, DPM;  Location[facility];  Service: Podiatry;  Laterality: Left;  . CESAREAN SECTION WITH BILATERAL TUBAL LIGATION N/A 10/12/2017   Procedure: CESAREAN SECTION WITH BILATERAL TUBAL LIGATION;  Surgeon: Delsa Bern, MD;  Location: [facility];  Service: Obstetrics;  Laterality: N/A;  . TONSILLECTOMY AND ADENOIDECTOMY    . TUBAL LIGATION    . WISDOM TOOTH EXTRACTION     Social History   Occupational History  . Not on file  Tobacco Use  . Smoking  status: Never Smoker  . Smokeless tobacco: Never Used  Vaping Use  . Vaping Use: Never used  Substance and Sexual Activity  . Alcohol use: No    Alcohol/week: 0.0 standard drinks  . Drug use: No  . Sexual activity: Yes    Birth control/protection: Surgical

## 2020-10-16 ENCOUNTER — Other Ambulatory Visit: Payer: Self-pay | Admitting: Physician Assistant

## 2020-10-16 ENCOUNTER — Ambulatory Visit: Payer: BC Managed Care – PPO | Admitting: Orthopedic Surgery

## 2020-10-16 DIAGNOSIS — M205X2 Other deformities of toe(s) (acquired), left foot: Secondary | ICD-10-CM

## 2020-10-19 ENCOUNTER — Other Ambulatory Visit: Payer: Self-pay

## 2020-10-24 ENCOUNTER — Encounter: Payer: Self-pay | Admitting: Orthopedic Surgery

## 2020-10-24 NOTE — Progress Notes (Signed)
Office Visit Note   Patient: Jordan Reeves           Date of Birth: 03-Aug-1971           MRN: [id number] Visit Date: 10/16/2020              Requested by: Rory Percy, MD [place],  [place] [address] PCP: Rory Percy, MD  Chief Complaint  Patient presents with  . Left Foot - Pain    2nd and 3rd toe       HPI: Patient is a 50 year old woman who was seen for evaluation for new ulcer over the third toe with fixed clawing of her lesser toes she is wearing a knee-high compression stocking.  Assessment & Plan: Visit Diagnoses:  1. Claw toe, acquired, left     Plan: Recommend continue use her compression stockings recommended protective shoe wear to unload pressure from the PIP joints.  Complete her doxycycline.  Patient is scheduled for surgery on the 15th.  Patient states that she has had Covid.  Plan for left foot second and third toe Weil osteotomy with PIP resection for the second and third toe and possibly tendon lengthening for the fifth toe.  Follow-Up Instructions: Return if symptoms worsen or fail to improve, for Follow-up 1 week after surgery.[facility]sey Exam  Patient is alert, oriented, no adenopathy, well-dressed, normal affect, normal respiratory effort. Examination patient has a strong dorsalis pedis pulse bilaterally.  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  She has a Wagner grade 1 ulcer over the first metatarsal head of the right foot and third toes dorsally of the right foot.  On the left foot she has a small amount Wagner grade 1 ulcer over the PIP joint of the third toe which is 3 mm in diameter with healthy granulation tissue recommended that she use mupirocin ointment.  Imaging: No results found. No images are attached to the encounter.  Labs: Lab Results  Component Value Date   HGBA1C 6.7 (H) 11/08/2018   ESRSEDRATE 20 04/11/2018    REPTSTATUS 11/15/2018 FINAL 11/10/2018   GRAMSTAIN  11/10/2018    FEW WBC PRESENT, PREDOMINANTLY MONONUCLEAR NO ORGANISMS SEEN    CULT  11/10/2018    RARE PSEUDOMONAS AERUGINOSA NO ANAEROBES ISOLATED Performed at [facility], [place] [address] [place], [place] [address]    [place] 11/10/2018     Lab Results  Component Value Date   ALBUMIN 4.2 04/11/2018   ALBUMIN 4.2 12/29/2017   ALBUMIN 2.5 (L) 10/13/2017    No results found for: MG No results found for: VD25OH  No results found for: PREALBUMIN CBC EXTENDED Latest Ref Rng & Units 04/11/2018 12/29/2017 10/13/2017  WBC 4.0 - 10.5 K/uL 12.0(H) 8.6 18.0(H)  RBC 3.87 - 5.11 MIL/uL 4.45 4.62 3.50(L)  HGB 12.0 - 15.0 g/dL 13.3 13.6 10.6(L)  HCT 36.0 - 46.0 % 39.1 39.9 30.3(L)  PLT 150 - 400 K/uL 286 279 228  NEUTROABS 1.7 - 7.7 K/uL 8.7(H) 5.9 -  LYMPHSABS 0.7 - 4.0 K/uL 2.1 1.9 -     There is no height or weight on file to calculate BMI.  Orders:  No orders of the defined types were placed in this encounter.  No orders of the defined types were placed in this encounter.    Procedures: No procedures performed  Clinical Data: No additional findings.  ROS:  All other systems negative, except as noted in the HPI. Review of Systems  Objective: Vital Signs: There were no vitals taken for this visit.  Specialty Comments:  No specialty comments available.  PMFS History: Patient Active Problem List   Diagnosis Date Noted  . Achilles tendon contracture, left   . Chronic hypertension complicating or reason for care during pregnancy, third trimester 10/11/2017  . 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 Diabetes mellitus without complication ([place]) 27/02/8674  . AMA (advanced maternal age) multigravida 35+ 05/01/2017  . Tachycardia 05/01/2017  . Obesity (BMI 30-39.9) 02/05/2016  . Benign essential HTN 01/25/2015  . PVCs (premature ventricular contractions) 01/25/2015  . Heart  palpitations 11/21/2014  . Thyroiditis    Past Medical History:  Diagnosis Date  . Benign essential HTN 01/25/2015  . Diabetes mellitus without complication ([place])    insuiln currently  . Dysrhythmia    PVCs last EKG NSR 09/2016  . Hypertension   . Hypothyroidism   . Infertility, female   . Obesity (BMI 30-39.9) 02/05/2016  . PCOS (polycystic ovarian syndrome)   . PVCs (premature ventricular contractions) 01/25/2015  . Thyroiditis    Hashimoto's now with hypothyroidism    Family History  Problem Relation Age of Onset  . Heart disease Paternal Grandfather   . Lymphoma Paternal Grandmother   . Heart disease Maternal Grandmother   . Heart disease Maternal Grandfather   . Heart disease Father   . Diabetes Mother   . Hypertension Mother   . Heart disease Mother        heart murmu  . Hyperlipidemia Mother   . Breast cancer Maternal Aunt   . Cervical cancer Maternal Aunt     Past Surgical History:  Procedure Laterality Date  . ACHILLES TENDON LENGTHENING Left 03/22/2019   Procedure: LEFT ACHILLES TENDON Z-LENGTHENING;  Surgeon: Newt Minion, MD;  Location: [facility];  Service: Orthopedics;  Laterality: Left;  . AMPUTATION Left 11/10/2018   Procedure: AMPUTATION TOE 4TH;  Surgeon: Tyson Babinski, DPM;  [facility];  Service: Podiatry;  Laterality: Left;  . CESAREAN SECTION WITH BILATERAL TUBAL LIGATION N/A 10/12/2017   Procedure: CESAREAN SECTION WITH BILATERAL TUBAL LIGATION;  Surgeon: Delsa Bern, MD;  Location: [facility];  Service: Obstetrics;  Laterality: N/A;  . TONSILLECTOMY AND ADENOIDECTOMY    . TUBAL LIGATION    . WISDOM TOOTH EXTRACTION     Social History   Occupational History  . Not on file  Tobacco Use  . Smoking status: Never Smoker  . Smokeless tobacco: Never Used  Vaping Use  . Vaping Use: Never used  Substance and Sexual Activity  . Alcohol use: No    Alcohol/week: 0.0 standard drinks  . Drug use: No  . Sexual activity:  Yes    Birth control/protection: Surgical

## 2020-10-30 ENCOUNTER — Encounter (HOSPITAL_BASED_OUTPATIENT_CLINIC_OR_DEPARTMENT_OTHER): Payer: Self-pay | Admitting: Orthopedic Surgery

## 2020-10-30 ENCOUNTER — Other Ambulatory Visit: Payer: Self-pay

## 2020-11-01 ENCOUNTER — Telehealth: Payer: Self-pay | Admitting: Orthopedic Surgery

## 2020-11-01 ENCOUNTER — Other Ambulatory Visit: Payer: Self-pay | Admitting: Orthopedic Surgery

## 2020-11-01 MED ORDER — DOXYCYCLINE HYCLATE 100 MG PO TABS: 100.0000 mg | ORAL_TABLET | Freq: Two times a day (BID) | ORAL | 0 refills | Status: DC

## 2020-11-01 NOTE — Telephone Encounter (Signed)
You saw this pt 10/16/20 for ulcer of claw tow that she is scheduled to have surgery on 11/06/20 treated with doxy and they are asking for refill for recurrence of same issue. Do wish to refill or does she need ov?

## 2020-11-01 NOTE — Telephone Encounter (Signed)
[facility] [place] called on behalf of the pt stating the pt has a surface infection on the toe that's being operated on and she was wondering if she could have a course of antibiotics sent in before the surgery?   [phone]

## 2020-11-01 NOTE — Telephone Encounter (Signed)
Rx for doxycycline sent to [facility] in [place]

## 2020-11-01 NOTE — Telephone Encounter (Signed)
I called pt and advised of below. Will call with any other questions.

## 2020-11-02 ENCOUNTER — Other Ambulatory Visit (HOSPITAL_COMMUNITY)
Admission: RE | Admit: 2020-11-02 | Discharge: 2020-11-02 | Disposition: A | Discharge status: Home / Self Care | Payer: BC Managed Care – PPO | Source: Ambulatory Visit | Attending: Orthopedic Surgery | Admitting: Orthopedic Surgery

## 2020-11-02 ENCOUNTER — Encounter (HOSPITAL_BASED_OUTPATIENT_CLINIC_OR_DEPARTMENT_OTHER)
Admission: RE | Admit: 2020-11-02 | Discharge: 2020-11-02 | Disposition: A | Discharge status: Home / Self Care | Payer: BC Managed Care – PPO | Source: Ambulatory Visit | Attending: Orthopedic Surgery | Admitting: Orthopedic Surgery

## 2020-11-02 DIAGNOSIS — Z20822 Contact with and (suspected) exposure to covid-19: Secondary | ICD-10-CM | POA: Diagnosis not present

## 2020-11-02 DIAGNOSIS — Z01810 Encounter for preprocedural cardiovascular examination: Secondary | ICD-10-CM | POA: Insufficient documentation

## 2020-11-02 DIAGNOSIS — Z01812 Encounter for preprocedural laboratory examination: Secondary | ICD-10-CM | POA: Insufficient documentation

## 2020-11-02 LAB — SARS CORONAVIRUS 2 (TAT 6-24 HRS): SARS Coronavirus 2: NEGATIVE

## 2020-11-02 NOTE — Pre-Procedure Instructions (Signed)

## 2020-11-05 DIAGNOSIS — Z8049 Family history of malignant neoplasm of other genital organs: Secondary | ICD-10-CM | POA: Diagnosis not present

## 2020-11-05 DIAGNOSIS — Z803 Family history of malignant neoplasm of breast: Secondary | ICD-10-CM | POA: Diagnosis not present

## 2020-11-05 DIAGNOSIS — Z833 Family history of diabetes mellitus: Secondary | ICD-10-CM | POA: Diagnosis not present

## 2020-11-05 DIAGNOSIS — Z8249 Family history of ischemic heart disease and other diseases of the circulatory system: Secondary | ICD-10-CM | POA: Diagnosis not present

## 2020-11-05 DIAGNOSIS — Q6689 Other  specified congenital deformities of feet: Secondary | ICD-10-CM | POA: Diagnosis present

## 2020-11-05 DIAGNOSIS — Z8349 Family history of other endocrine, nutritional and metabolic diseases: Secondary | ICD-10-CM | POA: Diagnosis not present

## 2020-11-05 DIAGNOSIS — Z807 Family history of other malignant neoplasms of lymphoid, hematopoietic and related tissues: Secondary | ICD-10-CM | POA: Diagnosis not present

## 2020-11-05 LAB — BASIC METABOLIC PANEL
Anion gap: 8 (ref 5–15)
BUN: 10 mg/dL (ref 6–20)
CO2: 26 mmol/L (ref 22–32)
Calcium: 9.8 mg/dL (ref 8.9–10.3)
Chloride: 102 mmol/L (ref 98–111)
Creatinine, Ser: 0.79 mg/dL (ref 0.44–1.00)
GFR, Estimated: >60 mL/min (ref >60)
Glucose, Bld: 136 mg/dL — ABNORMAL HIGH (ref 70–99)
Potassium: 4.9 mmol/L (ref 3.5–5.1)
Sodium: 136 mmol/L (ref 135–145)

## 2020-11-06 ENCOUNTER — Other Ambulatory Visit: Payer: Self-pay

## 2020-11-06 ENCOUNTER — Encounter (HOSPITAL_BASED_OUTPATIENT_CLINIC_OR_DEPARTMENT_OTHER)
Admission: RE | Disposition: A | Discharge status: Home / Self Care | Payer: Self-pay | Source: Home / Self Care | Attending: Orthopedic Surgery

## 2020-11-06 ENCOUNTER — Encounter (HOSPITAL_BASED_OUTPATIENT_CLINIC_OR_DEPARTMENT_OTHER): Payer: Self-pay | Admitting: Orthopedic Surgery

## 2020-11-06 ENCOUNTER — Ambulatory Visit (HOSPITAL_BASED_OUTPATIENT_CLINIC_OR_DEPARTMENT_OTHER)
Admission: RE | Admit: 2020-11-06 | Discharge: 2020-11-06 | Disposition: A | Discharge status: Home / Self Care | Payer: BC Managed Care – PPO | Attending: Orthopedic Surgery | Admitting: Orthopedic Surgery

## 2020-11-06 ENCOUNTER — Ambulatory Visit (HOSPITAL_BASED_OUTPATIENT_CLINIC_OR_DEPARTMENT_OTHER): Payer: BC Managed Care – PPO | Admitting: Certified Registered"

## 2020-11-06 DIAGNOSIS — Z8049 Family history of malignant neoplasm of other genital organs: Secondary | ICD-10-CM | POA: Insufficient documentation

## 2020-11-06 DIAGNOSIS — Q6689 Other  specified congenital deformities of feet: Secondary | ICD-10-CM | POA: Insufficient documentation

## 2020-11-06 DIAGNOSIS — Z8349 Family history of other endocrine, nutritional and metabolic diseases: Secondary | ICD-10-CM | POA: Insufficient documentation

## 2020-11-06 DIAGNOSIS — M205X2 Other deformities of toe(s) (acquired), left foot: Secondary | ICD-10-CM

## 2020-11-06 DIAGNOSIS — Z807 Family history of other malignant neoplasms of lymphoid, hematopoietic and related tissues: Secondary | ICD-10-CM | POA: Insufficient documentation

## 2020-11-06 DIAGNOSIS — Z803 Family history of malignant neoplasm of breast: Secondary | ICD-10-CM | POA: Insufficient documentation

## 2020-11-06 DIAGNOSIS — Z8249 Family history of ischemic heart disease and other diseases of the circulatory system: Secondary | ICD-10-CM | POA: Insufficient documentation

## 2020-11-06 DIAGNOSIS — Z833 Family history of diabetes mellitus: Secondary | ICD-10-CM | POA: Insufficient documentation

## 2020-11-06 HISTORY — PX: WEIL OSTEOTOMY: SHX5044

## 2020-11-06 LAB — GLUCOSE, CAPILLARY
Glucose-Capillary: 161 mg/dL — ABNORMAL HIGH (ref 70–99)
Glucose-Capillary: 153 mg/dL — ABNORMAL HIGH (ref 70–99)

## 2020-11-06 LAB — POCT PREGNANCY, URINE: Preg Test, Ur: NEGATIVE

## 2020-11-06 SURGERY — OSTEOTOMY, WEIL
Anesthesia: Monitor Anesthesia Care | Site: Foot | Laterality: Left

## 2020-11-06 MED ORDER — LACTATED RINGERS IV SOLN: INTRAVENOUS | Status: DC

## 2020-11-06 MED ORDER — PROPOFOL 500 MG/50ML IV EMUL
INTRAVENOUS | Status: DC | PRN
  Administered 2020-11-06: 150 ug/kg/min via INTRAVENOUS

## 2020-11-06 MED ORDER — CEFAZOLIN SODIUM-DEXTROSE 2-4 GM/100ML-% IV SOLN
2.0000 g | INTRAVENOUS | Status: AC
  Administered 2020-11-06: 2 g via INTRAVENOUS

## 2020-11-06 MED ORDER — LIDOCAINE 2% (20 MG/ML) 5 ML SYRINGE
INTRAMUSCULAR | Status: DC | PRN
  Administered 2020-11-06: 40 mg via INTRAVENOUS

## 2020-11-06 MED ORDER — FENTANYL CITRATE (PF) 100 MCG/2ML IJ SOLN: 25.0000 ug | INTRAMUSCULAR | Status: DC | PRN

## 2020-11-06 MED ORDER — OXYCODONE HCL 5 MG PO TABS: 5.0000 mg | ORAL_TABLET | Freq: Once | ORAL | Status: DC | PRN

## 2020-11-06 MED ORDER — ACETAMINOPHEN 10 MG/ML IV SOLN: 1000.0000 mg | Freq: Once | INTRAVENOUS | Status: DC | PRN

## 2020-11-06 MED ORDER — FENTANYL CITRATE (PF) 100 MCG/2ML IJ SOLN
INTRAMUSCULAR | Status: AC
  Filled 2020-11-06: qty 2

## 2020-11-06 MED ORDER — ONDANSETRON HCL 4 MG/2ML IJ SOLN
INTRAMUSCULAR | Status: DC | PRN
  Administered 2020-11-06: 4 mg via INTRAVENOUS

## 2020-11-06 MED ORDER — ACETAMINOPHEN 500 MG PO TABS: 1000.0000 mg | ORAL_TABLET | Freq: Once | ORAL | Status: DC | PRN

## 2020-11-06 MED ORDER — OXYCODONE-ACETAMINOPHEN 5-325 MG PO TABS: 1.0000 | ORAL_TABLET | ORAL | 0 refills | Status: DC | PRN

## 2020-11-06 MED ORDER — ACETAMINOPHEN 160 MG/5ML PO SOLN: 1000.0000 mg | Freq: Once | ORAL | Status: DC | PRN

## 2020-11-06 MED ORDER — CEFAZOLIN SODIUM-DEXTROSE 2-4 GM/100ML-% IV SOLN
INTRAVENOUS | Status: AC
  Filled 2020-11-06: qty 100

## 2020-11-06 MED ORDER — FENTANYL CITRATE (PF) 100 MCG/2ML IJ SOLN
50.0000 ug | Freq: Once | INTRAMUSCULAR | Status: AC
  Administered 2020-11-06: 50 ug via INTRAVENOUS

## 2020-11-06 MED ORDER — OXYCODONE HCL 5 MG/5ML PO SOLN
5.0000 mg | Freq: Once | ORAL | Status: DC | PRN
Start: 2020-11-06 — End: 2020-11-06

## 2020-11-06 MED ORDER — MIDAZOLAM HCL 2 MG/2ML IJ SOLN
INTRAMUSCULAR | Status: AC
  Filled 2020-11-06: qty 2

## 2020-11-06 MED ORDER — BUPIVACAINE-EPINEPHRINE (PF) 0.5% -1:200000 IJ SOLN
INTRAMUSCULAR | Status: DC | PRN
  Administered 2020-11-06: 30 mL via PERINEURAL

## 2020-11-06 MED ORDER — MIDAZOLAM HCL 2 MG/2ML IJ SOLN
1.0000 mg | Freq: Once | INTRAMUSCULAR | Status: AC
  Administered 2020-11-06: 2 mg via INTRAVENOUS

## 2020-11-06 SURGICAL SUPPLY — 46 items
BLADE OSC/SAG .038X5.5 CUT EDG (BLADE) ×4 IMPLANT
BLADE SURG 15 STRL LF DISP TIS (BLADE) ×2 IMPLANT
BLADE SURG 15 STRL SS (BLADE) ×4
BNDG CMPR 9X4 STRL LF SNTH (GAUZE/BANDAGES/DRESSINGS)
BNDG COHESIVE 4X5 TAN STRL (GAUZE/BANDAGES/DRESSINGS) ×2 IMPLANT
BNDG ESMARK 4X9 LF (GAUZE/BANDAGES/DRESSINGS) IMPLANT
BNDG GAUZE ELAST 4 BULKY (GAUZE/BANDAGES/DRESSINGS) ×2 IMPLANT
CAP PIN PROTECTOR ORTHO WHT (CAP) ×1 IMPLANT
COVER BACK TABLE 60X90IN (DRAPES) ×2 IMPLANT
COVER WAND RF STERILE (DRAPES) IMPLANT
DECANTER SPIKE VIAL GLASS SM (MISCELLANEOUS) IMPLANT
DRAPE EXTREMITY T 121X128X90 (DISPOSABLE) ×2 IMPLANT
DRAPE IMP U-DRAPE 54X76 (DRAPES) ×2 IMPLANT
DRAPE OEC MINIVIEW 54X84 (DRAPES) ×1 IMPLANT
DRAPE U-SHAPE 47X51 STRL (DRAPES) ×2 IMPLANT
DRSG EMULSION OIL 3X3 NADH (GAUZE/BANDAGES/DRESSINGS) ×2 IMPLANT
DRSG PAD ABDOMINAL 8X10 ST (GAUZE/BANDAGES/DRESSINGS) ×2 IMPLANT
DURAPREP 26ML APPLICATOR (WOUND CARE) ×2 IMPLANT
ELECT REM PT RETURN 9FT ADLT (ELECTROSURGICAL) ×2
ELECTRODE REM PT RTRN 9FT ADLT (ELECTROSURGICAL) ×1 IMPLANT
GAUZE 4X4 16PLY RFD (DISPOSABLE) IMPLANT
GAUZE SPONGE 4X4 12PLY STRL (GAUZE/BANDAGES/DRESSINGS) ×2 IMPLANT
GLOVE SURG ORTHO LTX SZ9 (GLOVE) ×2 IMPLANT
GLOVE SURG UNDER POLY LF SZ9 (GLOVE) ×3 IMPLANT
GOWN STRL REUS W/ TWL LRG LVL3 (GOWN DISPOSABLE) ×1 IMPLANT
GOWN STRL REUS W/TWL LRG LVL3 (GOWN DISPOSABLE)
GOWN STRL REUS W/TWL XL LVL3 (GOWN DISPOSABLE) ×3 IMPLANT
NDL HYPO 25X1 1.5 SAFETY (NEEDLE) IMPLANT
NEEDLE HYPO 25X1 1.5 SAFETY (NEEDLE) IMPLANT
NS IRRIG 1000ML POUR BTL (IV SOLUTION) ×2 IMPLANT
PACK BASIN DAY SURGERY FS (CUSTOM PROCEDURE TRAY) ×2 IMPLANT
PAD CAST 4YDX4 CTTN HI CHSV (CAST SUPPLIES) ×1 IMPLANT
PADDING CAST ABS 4INX4YD NS (CAST SUPPLIES) ×1
PADDING CAST ABS COTTON 4X4 ST (CAST SUPPLIES) ×1 IMPLANT
PADDING CAST COTTON 4X4 STRL (CAST SUPPLIES) ×2
PENCIL SMOKE EVACUATOR (MISCELLANEOUS) ×2 IMPLANT
SCREW CORTEX ST 2.0X12 (Screw) ×2 IMPLANT
SPONGE LAP 18X18 RF (DISPOSABLE) ×2 IMPLANT
STOCKINETTE 6  STRL (DRAPES) ×2
STOCKINETTE 6 STRL (DRAPES) ×1 IMPLANT
SUT ETHILON 2 0 FSLX (SUTURE) ×2 IMPLANT
SUT VIC AB 2-0 CT1 27 (SUTURE)
SUT VIC AB 2-0 CT1 TAPERPNT 27 (SUTURE) IMPLANT
SYR BULB EAR ULCER 3OZ GRN STR (SYRINGE) ×2 IMPLANT
SYR CONTROL 10ML LL (SYRINGE) IMPLANT
TOWEL GREEN STERILE FF (TOWEL DISPOSABLE) ×2 IMPLANT

## 2020-11-06 NOTE — Progress Notes (Signed)
Assisted Dr. Ermalene Postin with left, ultrasound guided, popliteal block. Side rails up, monitors on throughout procedure. See vital signs in flow sheet. Tolerated Procedure well.

## 2020-11-06 NOTE — Anesthesia Procedure Notes (Signed)
Anesthesia Regional Block: Popliteal block   Pre-Anesthetic Checklist: ,, timeout performed, Correct Patient, Correct Site, Correct Laterality, Correct Procedure, Correct Position, site marked, Risks and benefits discussed,  Surgical consent,  Pre-op evaluation,  At surgeon's request and post-op pain management  Laterality: Left and Lower  Prep: chloraprep       Needles:  Injection technique: Single-shot     Needle Length: 9cm  Needle Gauge: 22     Additional Needles: Arrow StimuQuik ECHO Echogenic Stimulating PNB Needle  Procedures:,,,, ultrasound used (permanent image in chart),,,,  Narrative:  Start time: 11/06/2020 9:04 AM End time: 11/06/2020 9:11 AM Injection made incrementally with aspirations every 5 mL.  Performed by: Personally  Anesthesiologist: Oleta Mouse, MD

## 2020-11-06 NOTE — Op Note (Signed)
11/06/2020  10:58 AM  PATIENT:  Jordan Reeves    PRE-OPERATIVE DIAGNOSIS:  Claw Toes Left Foot  POST-OPERATIVE DIAGNOSIS:  Same  PROCEDURE:  LEFT FOOT 2ND AND 3RD METATARSAL WEIL OSTEOTOMY, PROXIMAL INTERPHALANGEAL RESECTION 2ND AND 3RD TOES  SURGEON:  Newt Minion, MD  PHYSICIAN ASSISTANT:None ANESTHESIA:   General  PREOPERATIVE INDICATIONS:  Jordan Reeves is a  50 y.o. female with a diagnosis of Claw Toes Left Foot who failed conservative measures and elected for surgical management.    The risks benefits and alternatives were discussed with the patient preoperatively including but not limited to the risks of infection, bleeding, nerve injury, cardiopulmonary complications, the need for revision surgery, among others, and the patient was willing to proceed.  OPERATIVE IMPLANTS: 2 x 12 mm mini frag screws x2 and 1.6 K wire x2  @ENCIMAGES @  OPERATIVE FINDINGS: Patient's toes were straight after while and PIP resections.  OPERATIVE PROCEDURE: Patient was brought the operating room and undergoing a regional block the left lower extremity was prepped using DuraPrep draped into a sterile field a timeout was called.  A longitudinal incision was made along the second metatarsal.  Blunt dissection was carried down to the MTP joint.  Retractors were placed and a Weil osteotomy was performed of the second metatarsal metatarsal head was translated proximally and this was secured with a 2 x 12 mm mini frag screw.  The PIP joint was then resected back to bleeding viable subchondral bone a K wire was used retrograde and then antegrade to stabilize the PIP joint straight.  This was carried through the MTP joint into the metatarsal to stabilize the capsule.  A attention was then focused on the third metatarsal.  Retractors were placed a Weil osteotomy was performed of the third metatarsal head metatarsal head was translated proximally overhanging bone was resected and this was stabilized with  a 2 x 12 mm mini frag screw.  The PIP joint of the third toe was then approached through a separate incision.  Retractors were placed the PIP joint was resected the toe was straightened a guidewire was placed retrograde through the PIP joint then antegrade through the joint through the MTP joint and to the metatarsal to stabilize the capsule.  Both toes were straight.  Patient also had hyperflexion of the little toe and a relaxing incision was made for the extensor tendon to the little toe.  The wounds were irrigated with normal saline.  Incisions were closed using 2-0 nylon a sterile dressing was applied compression was placed from the tibial tubercle to the metatarsal heads.  Patient was taken the PACU in stable condition.   DISCHARGE PLANNING:  Antibiotic duration: Preoperative antibiotics  Weightbearing: Touchdown weightbearing on the left  Pain medication: Prescription for Percocet  Dressing care/ Wound VAC: Follow-up in 1 week to change the dressing  Ambulatory devices: Crutches  Discharge to: Home.  Follow-up: In the office 1 week post operative.

## 2020-11-06 NOTE — H&P (Signed)
Jordan Reeves is an 50 y.o. female.   Chief Complaint: Left Foot Metatarsalgia HPI: Patient is a 50 year old woman who was seen for evaluation for new ulcer over the third toe with fixed clawing of her lesser toes she is wearing a knee-high compression stocking.  Past Medical History:  Diagnosis Date  . Benign essential HTN 01/25/2015  . Diabetes mellitus without complication ([place])    insuiln currently  . Dysrhythmia    PVCs last EKG NSR 09/2016  . Hypertension   . Hypothyroidism   . Infertility, female   . Obesity (BMI 30-39.9) 02/05/2016  . PCOS (polycystic ovarian syndrome)   . PVCs (premature ventricular contractions) 01/25/2015  . Thyroiditis    Hashimoto's now with hypothyroidism    Past Surgical History:  Procedure Laterality Date  . ACHILLES TENDON LENGTHENING Left 03/22/2019   Procedure: LEFT ACHILLES TENDON Z-LENGTHENING;  Surgeon: Newt Minion, MD;  Location: [facility];  Service: Orthopedics;  Laterality: Left;  . AMPUTATION Left 11/10/2018   Procedure: AMPUTATION TOE 4TH;  Surgeon: Tyson Babinski, DPM;  Location: [facility];  Service: Podiatry;  Laterality: Left;  . CESAREAN SECTION WITH BILATERAL TUBAL LIGATION N/A 10/12/2017   Procedure: CESAREAN SECTION WITH BILATERAL TUBAL LIGATION;  Surgeon: Delsa Bern, MD;  Location: [facility];  Service: Obstetrics;  Laterality: N/A;  . TONSILLECTOMY AND ADENOIDECTOMY    . TUBAL LIGATION    . WISDOM TOOTH EXTRACTION      Family History  Problem Relation Age of Onset  . Heart disease Paternal Grandfather   . Lymphoma Paternal Grandmother   . Heart disease Maternal Grandmother   . Heart disease Maternal Grandfather   . Heart disease Father   . Diabetes Mother   . Hypertension Mother   . Heart disease Mother        heart murmu  . Hyperlipidemia Mother   . Breast cancer Maternal Aunt   . Cervical cancer Maternal Aunt    Social History:  reports that she has never smoked. She has never used  smokeless tobacco. She reports that she does not drink alcohol and does not use drugs.  Allergies: No Known Allergies  No medications prior to admission.    Results for orders placed or performed during the hospital encounter of 11/06/20 (from the past 48 hour(s))  Basic metabolic panel per protocol     Status: Abnormal   Collection Time: 11/05/20  7:31 AM  Result Value Ref Range   Sodium 136 135 - 145 mmol/L   Potassium 4.9 3.5 - 5.1 mmol/L   Chloride 102 98 - 111 mmol/L   CO2 26 22 - 32 mmol/L   Glucose, Bld 136 (H) 70 - 99 mg/dL    Comment: Glucose reference range applies only to samples taken after fasting for at least 8 hours.   BUN 10 6 - 20 mg/dL   Creatinine, Ser 0.79 0.44 - 1.00 mg/dL   Calcium 9.8 8.9 - 10.3 mg/dL   GFR, Estimated >60 >60 mL/min    Comment: (NOTE) Calculated using the CKD-EPI Creatinine Equation (2021)    Anion gap 8 5 - 15    Comment: Performed at [facility] [address], [place], [place] [address]   No results found.  Review of Systems  All other systems reviewed and are negative.   Height 5\' 8"  (1.727 m), weight 116.1 kg. Physical Exam  Patient is alert, oriented, no adenopathy, well-dressed, normal affect, normal respiratory effort. Examination patient has a  strong dorsalis pedis pulse bilaterally.  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  She has a Wagner grade 1 ulcer over the first metatarsal head of the right foot and third toes dorsally of the right foot.  On the left foot she has a small amount Wagner grade 1 ulcer over the PIP joint of the third toe which is 3 mm in diameter with healthy granulation tissue recommended that she use mupirocin ointment.Heart RRR Lungs Clear  Assessment/Plan 1. Claw toe, acquired, left     Plan: Recommend continue use her compression stockings recommended protective shoe wear to unload pressure  from the PIP joints.  Complete her doxycycline.  Patient is scheduled for surgery on the 15th.  Patient states that she has had Covid.  Plan for left foot second and third toe Weil osteotomy with PIP resection for the second and third toe and possibly tendon lengthening for the fifth toe.   Bevely Palmer Beni Turrell, PA 11/06/2020, 6:30 AM

## 2020-11-06 NOTE — Transfer of Care (Signed)
Immediate Anesthesia Transfer of Care Note  Patient: Jordan Reeves  Procedure(s) Performed: LEFT FOOT 2ND AND 3RD METATARSAL WEIL OSTEOTOMY, PROXIMAL INTERPHALANGEAL RESECTION 2ND AND 3RD TOES (Left Foot)  Patient Location: PACU  Anesthesia Type:MAC combined with regional for post-op pain  Level of Consciousness: awake, alert , oriented and patient cooperative  Airway & Oxygen Therapy: Patient Spontanous Breathing and Patient connected to face mask oxygen  Post-op Assessment: Report given to RN, Post -op Vital signs reviewed and stable and Patient moving all extremities  Post vital signs: Reviewed and stable  Last Vitals:  Vitals Value Taken Time  BP    Temp    Pulse    Resp    SpO2      Last Pain:  Vitals:   11/06/20 0734  TempSrc: Oral  PainSc: 0-No pain         Complications: No complications documented.

## 2020-11-06 NOTE — Discharge Instructions (Signed)

## 2020-11-06 NOTE — Anesthesia Preprocedure Evaluation (Signed)
Anesthesia Evaluation  Patient identified by MRN, date of birth, ID band Patient awake    Reviewed: Allergy & Precautions, NPO status , Patient's Chart, lab work & pertinent test results  History of Anesthesia Complications Negative for: history of anesthetic complications  Airway Mallampati: II  TM Distance: >3 FB Neck ROM: Full    Dental  (+) Dental Advisory Given, Teeth Intact   Pulmonary neg shortness of breath, neg sleep apnea, neg COPD, neg recent URI,  Covid-19 Nucleic Acid Test Results Lab Results      Component                Value               Date                      SARSCOV2NAA              NEGATIVE            11/02/2020                [place]              NEGATIVE            03/18/2019              breath sounds clear to auscultation       Cardiovascular hypertension, Pt. on home beta blockers and Pt. on medications + dysrhythmias  Rhythm:Regular     Neuro/Psych Lumbar radiculopathy with left leg and left foot numbness negative psych ROS   GI/Hepatic   Endo/Other  diabetesHypothyroidism Lab Results      Component                Value               Date                      HGBA1C                   6.7 (H)             11/08/2018           Lab Results      Component                Value               Date                      TSH                      3.238               04/11/2018             Renal/GU Lab Results      Component                Value               Date                      CREATININE               0.79                11/05/2020  Musculoskeletal Claw Toes Left Foot   Abdominal   Peds  Hematology Lab Results      Component                Value               Date                      WBC                      12.0 (H)            04/11/2018                HGB                      13.3                04/11/2018                HCT                      39.1                 04/11/2018                MCV                      87.9                04/11/2018                PLT                      286                 04/11/2018              Anesthesia Other Findings   Reproductive/Obstetrics negative OB ROS Lab Results      Component                Value               Date                      PREGTESTUR               NEGATIVE            11/06/2020                PREGSERUM                NEGATIVE            11/08/2018                                        Anesthesia Physical Anesthesia Plan  ASA: II  Anesthesia Plan: MAC and Regional   Post-op Pain Management:    Induction:   PONV Risk Score and Plan: 2 and Propofol infusion and Treatment may vary due to age or medical condition  Airway Management Planned: Nasal Cannula  Additional Equipment: None  Intra-op Plan:   Post-operative Plan:   Informed Consent: I have reviewed the patients History and Physical, chart, labs and discussed the procedure including the  risks, benefits and alternatives for the proposed anesthesia with the patient or authorized representative who has indicated his/her understanding and acceptance.     Dental advisory given  Plan Discussed with: CRNA and Surgeon  Anesthesia Plan Comments:         Anesthesia Quick Evaluation

## 2020-11-07 ENCOUNTER — Encounter (HOSPITAL_BASED_OUTPATIENT_CLINIC_OR_DEPARTMENT_OTHER): Payer: Self-pay | Admitting: Orthopedic Surgery

## 2020-11-07 ENCOUNTER — Encounter: Payer: Self-pay | Admitting: Orthopedic Surgery

## 2020-11-08 ENCOUNTER — Ambulatory Visit (INDEPENDENT_AMBULATORY_CARE_PROVIDER_SITE_OTHER): Payer: BC Managed Care – PPO | Admitting: Physician Assistant

## 2020-11-08 ENCOUNTER — Other Ambulatory Visit: Payer: Self-pay

## 2020-11-08 ENCOUNTER — Encounter (HOSPITAL_BASED_OUTPATIENT_CLINIC_OR_DEPARTMENT_OTHER): Payer: Self-pay | Admitting: Orthopedic Surgery

## 2020-11-08 DIAGNOSIS — M205X2 Other deformities of toe(s) (acquired), left foot: Secondary | ICD-10-CM

## 2020-11-08 NOTE — Anesthesia Postprocedure Evaluation (Signed)
Anesthesia Post Note  Patient: Jordan Reeves  Procedure(s) Performed: LEFT FOOT 2ND AND 3RD METATARSAL WEIL OSTEOTOMY, PROXIMAL INTERPHALANGEAL RESECTION 2ND AND 3RD TOES (Left Foot)     Patient location during evaluation: PACU Anesthesia Type: Regional and MAC Level of consciousness: awake and alert Pain management: pain level controlled Vital Signs Assessment: post-procedure vital signs reviewed and stable Respiratory status: spontaneous breathing, nonlabored ventilation, respiratory function stable and patient connected to nasal cannula oxygen Cardiovascular status: stable and blood pressure returned to baseline Postop Assessment: no apparent nausea or vomiting Anesthetic complications: no   No complications documented.  Last Vitals:  Vitals:   11/06/20 1115 11/06/20 1130  BP: 139/85 (!) 149/73  Pulse: (!) 59 62  Resp: 14 16  Temp:  36.6 C  SpO2: 100% 100%    Last Pain:  Vitals:   11/07/20 1017  TempSrc:   PainSc: 0-No pain                 Samoria Fedorko

## 2020-11-08 NOTE — Progress Notes (Signed)
Office Visit Note   Patient: Jordan Reeves           Date of Birth: 08-26-1970           MRN: [id number] Visit Date: 11/08/2020              Requested by: Rory Percy, MD [place],  [place] [address] PCP: Rory Percy, MD  Chief Complaint  Patient presents with  . Left Foot - Routine Post Op    11/06/20 left foot 2nd and 3rd MT Weil osteotomy PIP resection 2nd and 3rd toes       HPI: Patient presents today 2 days status post left foot second and third metatarsal Weil osteotomies with PIP resections of the second and third toes.  She had quite a bit of bloody drainage and thought maybe her dressing might need to be changed she is otherwise doing quite well.  She is just concerned because she does not have any feeling in her foot  Assessment & Plan: Visit Diagnoses: No diagnosis found.  Plan: New dressing was applied 4 x 4's Kerlix and Coban will follow-up at her regular appointment next week sooner if she has any issues  Follow-Up Instructions: No follow-ups on file.   Ortho Exam  Patient is alert, oriented, no adenopathy, well-dressed, normal affect, normal respiratory effort. Examination of her left foot pins are intact in the second and third toe she has some skin maceration in the third toe but toes are warm and pink.  Moderate soft tissue swelling as would be expected right 2 days after surgery.  Compartments are soft and nontender no evidence of any ascending cellulitis or infection  Imaging: No results found. No images are attached to the encounter.  Labs: Lab Results  Component Value Date   HGBA1C 6.7 (H) 11/08/2018   ESRSEDRATE 20 04/11/2018   REPTSTATUS 11/15/2018 FINAL 11/10/2018   GRAMSTAIN  11/10/2018    FEW WBC PRESENT, PREDOMINANTLY MONONUCLEAR NO ORGANISMS SEEN    CULT  11/10/2018    RARE PSEUDOMONAS AERUGINOSA NO ANAEROBES ISOLATED Performed at [facility], [place] [address], [place], [place] [address]    [place] 11/10/2018     Lab Results  Component Value Date   ALBUMIN 4.2 04/11/2018   ALBUMIN 4.2 12/29/2017   ALBUMIN 2.5 (L) 10/13/2017    No results found for: MG No results found for: VD25OH  No results found for: PREALBUMIN CBC EXTENDED Latest Ref Rng & Units 04/11/2018 12/29/2017 10/13/2017  WBC 4.0 - 10.5 K/uL 12.0(H) 8.6 18.0(H)  RBC 3.87 - 5.11 MIL/uL 4.45 4.62 3.50(L)  HGB 12.0 - 15.0 g/dL 13.3 13.6 10.6(L)  HCT 36.0 - 46.0 % 39.1 39.9 30.3(L)  PLT 150 - 400 K/uL 286 279 228  NEUTROABS 1.7 - 7.7 K/uL 8.7(H) 5.9 -  LYMPHSABS 0.7 - 4.0 K/uL 2.1 1.9 -     There is no height or weight on file to calculate BMI.  Orders:  No orders of the defined types were placed in this encounter.  No orders of the defined types were placed in this encounter.    Procedures: No procedures performed  Clinical Data: No additional findings.  ROS:  All other systems negative, except as noted in the HPI. Review of Systems  Objective: Vital Signs: LMP 10/18/2020   Specialty Comments:  No specialty comments available.  PMFS History: Patient Active Problem List   Diagnosis Date Noted  . Claw toe, acquired, left   .  Achilles tendon contracture, left   . Chronic hypertension complicating or reason for care during pregnancy, third trimester 10/11/2017  . 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 Diabetes mellitus without complication ([place]) 27/10/5007  . AMA (advanced maternal age) multigravida 35+ 05/01/2017  . Tachycardia 05/01/2017  . Obesity (BMI 30-39.9) 02/05/2016  . Benign essential HTN 01/25/2015  . PVCs (premature ventricular contractions) 01/25/2015  . Heart palpitations 11/21/2014  . Thyroiditis    Past Medical History:  Diagnosis Date  . Benign essential HTN 01/25/2015  . Diabetes mellitus without complication ([place])    insuiln currently  . Dysrhythmia    PVCs last EKG NSR 09/2016  . Hypertension   . Hypothyroidism   . Infertility, female   .  Obesity (BMI 30-39.9) 02/05/2016  . PCOS (polycystic ovarian syndrome)   . PVCs (premature ventricular contractions) 01/25/2015  . Thyroiditis    Hashimoto's now with hypothyroidism    Family History  Problem Relation Age of Onset  . Heart disease Paternal Grandfather   . Lymphoma Paternal Grandmother   . Heart disease Maternal Grandmother   . Heart disease Maternal Grandfather   . Heart disease Father   . Diabetes Mother   . Hypertension Mother   . Heart disease Mother        heart murmu  . Hyperlipidemia Mother   . Breast cancer Maternal Aunt   . Cervical cancer Maternal Aunt     Past Surgical History:  Procedure Laterality Date  . ACHILLES TENDON LENGTHENING Left 03/22/2019   Procedure: LEFT ACHILLES TENDON Z-LENGTHENING;  Surgeon: Newt Minion, MD;  Location: [facility];  Service: Orthopedics;  Laterality: Left;  . AMPUTATION Left 11/10/2018   Procedure: AMPUTATION TOE 4TH;  Surgeon: Tyson Babinski, DPM;  Location[facility];  Service: Podiatry;  Laterality: Left;  . CESAREAN SECTION WITH BILATERAL TUBAL LIGATION N/A 10/12/2017   Procedure: CESAREAN SECTION WITH BILATERAL TUBAL LIGATION;  Surgeon: Delsa Bern, MD;  Location: [facility];  Service: Obstetrics;  Laterality: N/A;  . TONSILLECTOMY AND ADENOIDECTOMY    . TUBAL LIGATION    . WEIL OSTEOTOMY Left 11/06/2020   Procedure: LEFT FOOT 2ND AND 3RD METATARSAL WEIL OSTEOTOMY, PROXIMAL INTERPHALANGEAL RESECTION 2ND AND 3RD TOES;  Surgeon: Newt Minion, MD;  Location: [facility];  Service: Orthopedics;  Laterality: Left;  . WISDOM TOOTH EXTRACTION     Social History   Occupational History  . Not on file  Tobacco Use  . Smoking status: Never Smoker  . Smokeless tobacco: Never Used  Vaping Use  . Vaping Use: Never used  Substance and Sexual Activity  . Alcohol use: No    Alcohol/week: 0.0 standard drinks  . Drug use: No  . Sexual activity: Yes    Birth control/protection:  Surgical

## 2020-11-13 ENCOUNTER — Ambulatory Visit (INDEPENDENT_AMBULATORY_CARE_PROVIDER_SITE_OTHER): Payer: BC Managed Care – PPO | Admitting: Family

## 2020-11-13 DIAGNOSIS — M205X2 Other deformities of toe(s) (acquired), left foot: Secondary | ICD-10-CM

## 2020-11-14 ENCOUNTER — Encounter: Payer: Self-pay | Admitting: Family

## 2020-11-14 NOTE — Progress Notes (Signed)
Post-Op Visit Note   Patient: Jordan Reeves           Date of Birth: May 27, 1971           MRN: [id number] Visit Date: 11/13/2020 PCP: Rory Percy, MD  Chief Complaint: No chief complaint on file.   HPI:  HPI The patient is a 50 year old woman who presents today 1 week status post Weil osteotomies.  Surgical dressing removed.  She has been minimizing her weightbearing.  Ortho Exam Incision well approximated sutures healing well this is clean dry and intact.  The pins are in place toes are straight  Visit Diagnoses:  1. Claw toe, acquired, left     Plan: Begin daily Dial soap cleansing.  She will paint the pin tracks with Neosporin.  Minimize weightbearing.  Follow-up in 1 week for pin removal.  Follow-Up Instructions: Return in about 1 week (around 11/20/2020).   Imaging: No results found.  Orders:  No orders of the defined types were placed in this encounter.  No orders of the defined types were placed in this encounter.    PMFS History: Patient Active Problem List   Diagnosis Date Noted  . Claw toe, acquired, left   . Achilles tendon contracture, left   . Chronic hypertension complicating or reason for care during pregnancy, third trimester 10/11/2017  . 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 Diabetes mellitus without complication ([place]) 19/14/7829  . AMA (advanced maternal age) multigravida 35+ 05/01/2017  . Tachycardia 05/01/2017  . Obesity (BMI 30-39.9) 02/05/2016  . Benign essential HTN 01/25/2015  . PVCs (premature ventricular contractions) 01/25/2015  . Heart palpitations 11/21/2014  . Thyroiditis    Past Medical History:  Diagnosis Date  . Benign essential HTN 01/25/2015  . Diabetes mellitus without complication ([place])    insuiln currently  . Dysrhythmia    PVCs last EKG NSR 09/2016  . Hypertension   . Hypothyroidism   . Infertility, female   . Obesity (BMI 30-39.9) 02/05/2016  . PCOS (polycystic ovarian syndrome)   . PVCs  (premature ventricular contractions) 01/25/2015  . Thyroiditis    Hashimoto's now with hypothyroidism    Family History  Problem Relation Age of Onset  . Heart disease Paternal Grandfather   . Lymphoma Paternal Grandmother   . Heart disease Maternal Grandmother   . Heart disease Maternal Grandfather   . Heart disease Father   . Diabetes Mother   . Hypertension Mother   . Heart disease Mother        heart murmu  . Hyperlipidemia Mother   . Breast cancer Maternal Aunt   . Cervical cancer Maternal Aunt     Past Surgical History:  Procedure Laterality Date  . ACHILLES TENDON LENGTHENING Left 03/22/2019   Procedure: LEFT ACHILLES TENDON Z-LENGTHENING;  Surgeon: Newt Minion, MD;  Location: [facility];  Service: Orthopedics;  Laterality: Left;  . AMPUTATION Left 11/10/2018   Procedure: AMPUTATION TOE 4TH;  Surgeon: Tyson Babinski, DPM;  Location[facility];  Service: Podiatry;  Laterality: Left;  . CESAREAN SECTION WITH BILATERAL TUBAL LIGATION N/A 10/12/2017   Procedure: CESAREAN SECTION WITH BILATERAL TUBAL LIGATION;  Surgeon: Delsa Bern, MD;  Location: [facility];  Service: Obstetrics;  Laterality: N/A;  . TONSILLECTOMY AND ADENOIDECTOMY    . TUBAL LIGATION    . WEIL OSTEOTOMY Left 11/06/2020   Procedure: LEFT FOOT 2ND AND 3RD METATARSAL WEIL OSTEOTOMY, PROXIMAL INTERPHALANGEAL RESECTION 2ND AND 3RD TOES;  Surgeon: Newt Minion, MD;  Location: [facility];  Service: Orthopedics;  Laterality: Left;  . WISDOM TOOTH EXTRACTION     Social History   Occupational History  . Not on file  Tobacco Use  . Smoking status: Never Smoker  . Smokeless tobacco: Never Used  Vaping Use  . Vaping Use: Never used  Substance and Sexual Activity  . Alcohol use: No    Alcohol/week: 0.0 standard drinks  . Drug use: No  . Sexual activity: Yes    Birth control/protection: Surgical

## 2020-11-19 ENCOUNTER — Ambulatory Visit (INDEPENDENT_AMBULATORY_CARE_PROVIDER_SITE_OTHER): Payer: BC Managed Care – PPO

## 2020-11-19 ENCOUNTER — Encounter: Payer: Self-pay | Admitting: Orthopedic Surgery

## 2020-11-19 ENCOUNTER — Ambulatory Visit (INDEPENDENT_AMBULATORY_CARE_PROVIDER_SITE_OTHER): Payer: BC Managed Care – PPO | Admitting: Physician Assistant

## 2020-11-19 DIAGNOSIS — M79672 Pain in left foot: Secondary | ICD-10-CM

## 2020-11-19 MED ORDER — DOXYCYCLINE HYCLATE 100 MG PO TABS: 100.0000 mg | ORAL_TABLET | Freq: Two times a day (BID) | ORAL | 0 refills | Status: DC

## 2020-11-19 NOTE — Progress Notes (Signed)
Office Visit Note   Patient: Jordan Reeves           Date of Birth: 1971/01/28           MRN: [id number] Visit Date: 11/19/2020              Requested by: Rory Percy, MD [place],  [place] [address] PCP: Rory Percy, MD  Chief Complaint  Patient presents with  . Left Foot - Routine Post Op    11/06/20 left foot 2nd and 3rd MT weil osteotomy PIP resection 2nd and 3rd toes.       HPI: Patient is 13 days status post correction of second and third claw toe deformities on her left foot and Weil osteotomies.  She comes in today early because she caught the pins on what she has been using to keep her foot dry.  She was concerned she had moved the pins substantially she has neuropathy but could feel this and was concerned  Assessment & Plan: Visit Diagnoses:  1. Pain in left foot     Plan: We will place her on a short course of antibiotics follow-up in 1 week at which time remainder the sutures will be removed  Follow-Up Instructions: No follow-ups on file.   Ortho Exam  Patient is alert, oriented, no adenopathy, well-dressed, normal affect, normal respiratory effort. Examination of the left foot she has a bounding pulse.  Surgical sutures are in place across the dorsum and into the second and third toes.  On the third toe she does have some darkness over the PIP joint.  After debriding this this seems more likely to be a blood blister.  Sutures were removed in this area overall well-healed.  She does have some cracking at the base of the second and third toes on the plantar surface.  These areas do not probe down to bone no drainage.  She has no ascending cellulitis.  Should continue daily cleansing with Dial soap and elevating her foot and no forefoot weightbearing  Imaging: XR Foot 2 Views Left  Result Date: 11/19/2020 Her foot were taken today.  Demonstrate findings consistent with correction of claw toe deformities second and third with pins in place.  Hardware in  place from osteotomies.  Overall well-maintained alignment  No images are attached to the encounter.  Labs: Lab Results  Component Value Date   HGBA1C 6.7 (H) 11/08/2018   ESRSEDRATE 20 04/11/2018   REPTSTATUS 11/15/2018 FINAL 11/10/2018   GRAMSTAIN  11/10/2018    FEW WBC PRESENT, PREDOMINANTLY MONONUCLEAR NO ORGANISMS SEEN    CULT  11/10/2018    RARE PSEUDOMONAS AERUGINOSA NO ANAEROBES ISOLATED Performed at [facility], [place] [address], [place], [place] [address]    [place] 11/10/2018     Lab Results  Component Value Date   ALBUMIN 4.2 04/11/2018   ALBUMIN 4.2 12/29/2017   ALBUMIN 2.5 (L) 10/13/2017    No results found for: MG No results found for: VD25OH  No results found for: PREALBUMIN CBC EXTENDED Latest Ref Rng & Units 04/11/2018 12/29/2017 10/13/2017  WBC 4.0 - 10.5 K/uL 12.0(H) 8.6 18.0(H)  RBC 3.87 - 5.11 MIL/uL 4.45 4.62 3.50(L)  HGB 12.0 - 15.0 g/dL 13.3 13.6 10.6(L)  HCT 36.0 - 46.0 % 39.1 39.9 30.3(L)  PLT 150 - 400 K/uL 286 279 228  NEUTROABS 1.7 - 7.7 K/uL 8.7(H) 5.9 -  LYMPHSABS 0.7 - 4.0 K/uL 2.1 1.9 -  There is no height or weight on file to calculate BMI.  Orders:  Orders Placed This Encounter  Procedures  . XR Foot 2 Views Left   No orders of the defined types were placed in this encounter.    Procedures: No procedures performed  Clinical Data: No additional findings.  ROS:  All other systems negative, except as noted in the HPI. Review of Systems  Objective: Vital Signs: There were no vitals taken for this visit.  Specialty Comments:  No specialty comments available.  PMFS History: Patient Active Problem List   Diagnosis Date Noted  . Claw toe, acquired, left   . Achilles tendon contracture, left   . Chronic hypertension complicating or reason for care during pregnancy, third trimester 10/11/2017  . 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 Diabetes mellitus without complication  ([place]) 62/94/7654  . AMA (advanced maternal age) multigravida 35+ 05/01/2017  . Tachycardia 05/01/2017  . Obesity (BMI 30-39.9) 02/05/2016  . Benign essential HTN 01/25/2015  . PVCs (premature ventricular contractions) 01/25/2015  . Heart palpitations 11/21/2014  . Thyroiditis    Past Medical History:  Diagnosis Date  . Benign essential HTN 01/25/2015  . Diabetes mellitus without complication ([employer])    insuiln currently  . Dysrhythmia    PVCs last EKG NSR 09/2016  . Hypertension   . Hypothyroidism   . Infertility, female   . Obesity (BMI 30-39.9) 02/05/2016  . PCOS (polycystic ovarian syndrome)   . PVCs (premature ventricular contractions) 01/25/2015  . Thyroiditis    Hashimoto's now with hypothyroidism    Family History  Problem Relation Age of Onset  . Heart disease Paternal Grandfather   . Lymphoma Paternal Grandmother   . Heart disease Maternal Grandmother   . Heart disease Maternal Grandfather   . Heart disease Father   . Diabetes Mother   . Hypertension Mother   . Heart disease Mother        heart murmu  . Hyperlipidemia Mother   . Breast cancer Maternal Aunt   . Cervical cancer Maternal Aunt     Past Surgical History:  Procedure Laterality Date  . ACHILLES TENDON LENGTHENING Left 03/22/2019   Procedure: LEFT ACHILLES TENDON Z-LENGTHENING;  Surgeon: Newt Minion, MD;  Location: [facility] [facility];  Service: Orthopedics;  Laterality: Left;  . AMPUTATION Left 11/10/2018   Procedure: AMPUTATION TOE 4TH;  Surgeon: Tyson Babinski, DPM;  Location[facility];  Service: Podiatry;  Laterality: Left;  . CESAREAN SECTION WITH BILATERAL TUBAL LIGATION N/A 10/12/2017   Procedure: CESAREAN SECTION WITH BILATERAL TUBAL LIGATION;  Surgeon: Delsa Bern, MD;  Location: [facility];  Service: Obstetrics;  Laterality: N/A;  . TONSILLECTOMY AND ADENOIDECTOMY    . TUBAL LIGATION    . WEIL OSTEOTOMY Left 11/06/2020   Procedure: LEFT FOOT 2ND AND 3RD METATARSAL WEIL  OSTEOTOMY, PROXIMAL INTERPHALANGEAL RESECTION 2ND AND 3RD TOES;  Surgeon: Newt Minion, MD;  Location: [facility];  Service: Orthopedics;  Laterality: Left;  . WISDOM TOOTH EXTRACTION     Social History   Occupational History  . Not on file  Tobacco Use  . Smoking status: Never Smoker  . Smokeless tobacco: Never Used  Vaping Use  . Vaping Use: Never used  Substance and Sexual Activity  . Alcohol use: No    Alcohol/week: 0.0 standard drinks  . Drug use: No  . Sexual activity: Yes    Birth control/protection: Surgical

## 2020-11-20 ENCOUNTER — Ambulatory Visit: Payer: BC Managed Care – PPO | Admitting: Orthopedic Surgery

## 2020-11-27 ENCOUNTER — Ambulatory Visit (INDEPENDENT_AMBULATORY_CARE_PROVIDER_SITE_OTHER): Payer: BC Managed Care – PPO | Admitting: Physician Assistant

## 2020-11-27 ENCOUNTER — Encounter: Payer: Self-pay | Admitting: Orthopedic Surgery

## 2020-11-27 DIAGNOSIS — M205X2 Other deformities of toe(s) (acquired), left foot: Secondary | ICD-10-CM

## 2020-11-27 NOTE — Progress Notes (Signed)
Office Visit Note   Patient: Jordan Reeves           Date of Birth: 1971-04-17           MRN: [id number] Visit Date: 11/27/2020              Requested by: Rory Percy, MD [place],  [place] [address] PCP: Rory Percy, MD  Chief Complaint  Patient presents with  . Left Foot - Routine Post Op    11/06/20 left foot 2nd and 3rd MT weil osteotomy PIP resection 2nd and 3rd toes.       HPI: Presents today she is 3 weeks status post correction of second and third claw toe deformities on her left foot and Weil osteotomies.  She feels she is doing better this week.  She still has some swelling in the toes but this improves with elevation.  She has a Vive compression sock that she will place on her foot   Assessment & Plan: Visit Diagnoses: No diagnosis found.  Plan: Patient will follow up in 1 week at which time x-ray should be taken I would anticipate advancing her weightbearing at that time  Follow-Up Instructions: No follow-ups on file.   Ortho Exam  Patient is alert, oriented, no adenopathy, well-dressed, normal affect, normal respiratory effort. Well-healed surgical incision I remove the remainder of the surgical sutures today she has moderate soft tissue swelling especially in the digits but this improves with elevation.  No ascending cellulitis overall well-maintained alignment of her toes  Imaging: No results found. No images are attached to the encounter.  Labs: Lab Results  Component Value Date   HGBA1C 6.7 (H) 11/08/2018   ESRSEDRATE 20 04/11/2018   REPTSTATUS 11/15/2018 FINAL 11/10/2018   GRAMSTAIN  11/10/2018    FEW WBC PRESENT, PREDOMINANTLY MONONUCLEAR NO ORGANISMS SEEN    CULT  11/10/2018    RARE PSEUDOMONAS AERUGINOSA NO ANAEROBES ISOLATED Performed at [facility], [place] [address], [place], [place] [address]    [place] 11/10/2018     Lab Results  Component Value Date   ALBUMIN 4.2 04/11/2018   ALBUMIN 4.2  12/29/2017   ALBUMIN 2.5 (L) 10/13/2017    No results found for: MG No results found for: VD25OH  No results found for: PREALBUMIN CBC EXTENDED Latest Ref Rng & Units 04/11/2018 12/29/2017 10/13/2017  WBC 4.0 - 10.5 K/uL 12.0(H) 8.6 18.0(H)  RBC 3.87 - 5.11 MIL/uL 4.45 4.62 3.50(L)  HGB 12.0 - 15.0 g/dL 13.3 13.6 10.6(L)  HCT 36.0 - 46.0 % 39.1 39.9 30.3(L)  PLT 150 - 400 K/uL 286 279 228  NEUTROABS 1.7 - 7.7 K/uL 8.7(H) 5.9 -  LYMPHSABS 0.7 - 4.0 K/uL 2.1 1.9 -     There is no height or weight on file to calculate BMI.  Orders:  No orders of the defined types were placed in this encounter.  No orders of the defined types were placed in this encounter.    Procedures: No procedures performed  Clinical Data: No additional findings.  ROS:  All other systems negative, except as noted in the HPI. Review of Systems  Objective: Vital Signs: There were no vitals taken for this visit.  Specialty Comments:  No specialty comments available.  PMFS History: Patient Active Problem List   Diagnosis Date Noted  . Claw toe, acquired, left   . Achilles tendon contracture, left   . Chronic hypertension complicating or reason for care during pregnancy, third  trimester 10/11/2017  . 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 Diabetes mellitus without complication ([place]) 82/95/6213  . AMA (advanced maternal age) multigravida 35+ 05/01/2017  . Tachycardia 05/01/2017  . Obesity (BMI 30-39.9) 02/05/2016  . Benign essential HTN 01/25/2015  . PVCs (premature ventricular contractions) 01/25/2015  . Heart palpitations 11/21/2014  . Thyroiditis    Past Medical History:  Diagnosis Date  . Benign essential HTN 01/25/2015  . Diabetes mellitus without complication ([place])    insuiln currently  . Dysrhythmia    PVCs last EKG NSR 09/2016  . Hypertension   . Hypothyroidism   . Infertility, female   . Obesity (BMI 30-39.9) 02/05/2016  . PCOS (polycystic ovarian syndrome)   . PVCs  (premature ventricular contractions) 01/25/2015  . Thyroiditis    Hashimoto's now with hypothyroidism    Family History  Problem Relation Age of Onset  . Heart disease Paternal Grandfather   . Lymphoma Paternal Grandmother   . Heart disease Maternal Grandmother   . Heart disease Maternal Grandfather   . Heart disease Father   . Diabetes Mother   . Hypertension Mother   . Heart disease Mother        heart murmu  . Hyperlipidemia Mother   . Breast cancer Maternal Aunt   . Cervical cancer Maternal Aunt     Past Surgical History:  Procedure Laterality Date  . ACHILLES TENDON LENGTHENING Left 03/22/2019   Procedure: LEFT ACHILLES TENDON Z-LENGTHENING;  Surgeon: Newt Minion, MD;  Location: [facility];  Service: Orthopedics;  Laterality: Left;  . AMPUTATION Left 11/10/2018   Procedure: AMPUTATION TOE 4TH;  Surgeon: Tyson Babinski, DPM;  Location[facility];  Service: Podiatry;  Laterality: Left;  . CESAREAN SECTION WITH BILATERAL TUBAL LIGATION N/A 10/12/2017   Procedure: CESAREAN SECTION WITH BILATERAL TUBAL LIGATION;  Surgeon: Delsa Bern, MD;  Location: [facility];  Service: Obstetrics;  Laterality: N/A;  . TONSILLECTOMY AND ADENOIDECTOMY    . TUBAL LIGATION    . WEIL OSTEOTOMY Left 11/06/2020   Procedure: LEFT FOOT 2ND AND 3RD METATARSAL WEIL OSTEOTOMY, PROXIMAL INTERPHALANGEAL RESECTION 2ND AND 3RD TOES;  Surgeon: Newt Minion, MD;  Location: [facility];  Service: Orthopedics;  Laterality: Left;  . WISDOM TOOTH EXTRACTION     Social History   Occupational History  . Not on file  Tobacco Use  . Smoking status: Never Smoker  . Smokeless tobacco: Never Used  Vaping Use  . Vaping Use: Never used  Substance and Sexual Activity  . Alcohol use: No    Alcohol/week: 0.0 standard drinks  . Drug use: No  . Sexual activity: Yes    Birth control/protection: Surgical

## 2020-12-04 ENCOUNTER — Ambulatory Visit (INDEPENDENT_AMBULATORY_CARE_PROVIDER_SITE_OTHER): Payer: BC Managed Care – PPO | Admitting: Physician Assistant

## 2020-12-04 ENCOUNTER — Encounter: Payer: Self-pay | Admitting: Physician Assistant

## 2020-12-04 ENCOUNTER — Ambulatory Visit (INDEPENDENT_AMBULATORY_CARE_PROVIDER_SITE_OTHER): Payer: BC Managed Care – PPO

## 2020-12-04 DIAGNOSIS — M79672 Pain in left foot: Secondary | ICD-10-CM

## 2020-12-04 NOTE — Progress Notes (Signed)
Office Visit Note   Patient: Jordan Reeves           Date of Birth: November 28, 1970           MRN: [id number] Visit Date: 12/04/2020              Requested by: Rory Percy, MD [place],  [place] [address] PCP: Rory Percy, MD  Chief Complaint  Patient presents with  . Left Foot - Routine Post Op    11/06/20 left foot Weil osteotomy 2nd and 3rd MT PIP resection 2nd and 3rd toes       HPI: Patient is 4 weeks status post left foot second and third metatarsal Weil osteotomies with PIP resections of the second and third toes.  She has been minimizing her weightbearing and wearing a postop shoe.  She has some swelling in her toes but she says this is normal for her.  She also has some discoloration but she says when she elevates her foot this improves  Assessment & Plan: Visit Diagnoses:  1. Pain in left foot     Plan: She may begin weightbearing as tolerated in her postop shoe or her [place] shoe.  She can do general stretching at the MTP joints.  She will continue to wear her compression sock I recommended using some cocoa butter on the foot.  Follow-up in 2 weeks.  Follow-Up Instructions: No follow-ups on file.   Ortho Exam  Patient is alert, oriented, no adenopathy, well-dressed, normal affect, normal respiratory effort. Examination of her left foot she has an easily palpable dorsalis pedis pulse.  She does have some swelling in the second and third digits but improves with elevation.  Some discoloration which also improves.  No signs of cellulitis or infection.  Overall well-maintained alignment.  Imaging: No results found. No images are attached to the encounter.  Labs: Lab Results  Component Value Date   HGBA1C 6.7 (H) 11/08/2018   ESRSEDRATE 20 04/11/2018   REPTSTATUS 11/15/2018 FINAL 11/10/2018   GRAMSTAIN  11/10/2018    FEW WBC PRESENT, PREDOMINANTLY MONONUCLEAR NO ORGANISMS SEEN    CULT  11/10/2018    RARE PSEUDOMONAS AERUGINOSA NO ANAEROBES  ISOLATED Performed at [facility], [place] [address] [place], [place] [address]    [place] 11/10/2018     Lab Results  Component Value Date   ALBUMIN 4.2 04/11/2018   ALBUMIN 4.2 12/29/2017   ALBUMIN 2.5 (L) 10/13/2017    No results found for: MG No results found for: VD25OH  No results found for: PREALBUMIN CBC EXTENDED Latest Ref Rng & Units 04/11/2018 12/29/2017 10/13/2017  WBC 4.0 - 10.5 K/uL 12.0(H) 8.6 18.0(H)  RBC 3.87 - 5.11 MIL/uL 4.45 4.62 3.50(L)  HGB 12.0 - 15.0 g/dL 13.3 13.6 10.6(L)  HCT 36.0 - 46.0 % 39.1 39.9 30.3(L)  PLT 150 - 400 K/uL 286 279 228  NEUTROABS 1.7 - 7.7 K/uL 8.7(H) 5.9 -  LYMPHSABS 0.7 - 4.0 K/uL 2.1 1.9 -     There is no height or weight on file to calculate BMI.  Orders:  Orders Placed This Encounter  Procedures  . XR Foot 2 Views Left   No orders of the defined types were placed in this encounter.    Procedures: No procedures performed  Clinical Data: No additional findings.  ROS:  All other systems negative, except as noted in the HPI. Review of Systems  Objective: Vital Signs: There were no vitals taken for  this visit.  Specialty Comments:  No specialty comments available.  PMFS History: Patient Active Problem List   Diagnosis Date Noted  . Claw toe, acquired, left   . Achilles tendon contracture, left   . Chronic hypertension complicating or reason for care during pregnancy, third trimester 10/11/2017  . 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 Diabetes mellitus without complication ([place]) 35/00/9381  . AMA (advanced maternal age) multigravida 35+ 05/01/2017  . Tachycardia 05/01/2017  . Obesity (BMI 30-39.9) 02/05/2016  . Benign essential HTN 01/25/2015  . PVCs (premature ventricular contractions) 01/25/2015  . Heart palpitations 11/21/2014  . Thyroiditis    Past Medical History:  Diagnosis Date  . Benign essential HTN 01/25/2015  . Diabetes mellitus without complication  ([place])    insuiln currently  . Dysrhythmia    PVCs last EKG NSR 09/2016  . Hypertension   . Hypothyroidism   . Infertility, female   . Obesity (BMI 30-39.9) 02/05/2016  . PCOS (polycystic ovarian syndrome)   . PVCs (premature ventricular contractions) 01/25/2015  . Thyroiditis    Hashimoto's now with hypothyroidism    Family History  Problem Relation Age of Onset  . Heart disease Paternal Grandfather   . Lymphoma Paternal Grandmother   . Heart disease Maternal Grandmother   . Heart disease Maternal Grandfather   . Heart disease Father   . Diabetes Mother   . Hypertension Mother   . Heart disease Mother        heart murmu  . Hyperlipidemia Mother   . Breast cancer Maternal Aunt   . Cervical cancer Maternal Aunt     Past Surgical History:  Procedure Laterality Date  . ACHILLES TENDON LENGTHENING Left 03/22/2019   Procedure: LEFT ACHILLES TENDON Z-LENGTHENING;  Surgeon: Newt Minion, MD;  Location: [facility];  Service: Orthopedics;  Laterality: Left;  . AMPUTATION Left 11/10/2018   Procedure: AMPUTATION TOE 4TH;  Surgeon: Tyson Babinski, DPM;  [facility];  Service: Podiatry;  Laterality: Left;  . CESAREAN SECTION WITH BILATERAL TUBAL LIGATION N/A 10/12/2017   Procedure: CESAREAN SECTION WITH BILATERAL TUBAL LIGATION;  Surgeon: Delsa Bern, MD;  Location: [facility];  Service: Obstetrics;  Laterality: N/A;  . TONSILLECTOMY AND ADENOIDECTOMY    . TUBAL LIGATION    . WEIL OSTEOTOMY Left 11/06/2020   Procedure: LEFT FOOT 2ND AND 3RD METATARSAL WEIL OSTEOTOMY, PROXIMAL INTERPHALANGEAL RESECTION 2ND AND 3RD TOES;  Surgeon: Newt Minion, MD;  Location: [facility];  Service: Orthopedics;  Laterality: Left;  . WISDOM TOOTH EXTRACTION     Social History   Occupational History  . Not on file  Tobacco Use  . Smoking status: Never Smoker  . Smokeless tobacco: Never Used  Vaping Use  . Vaping Use: Never used  Substance and Sexual  Activity  . Alcohol use: No    Alcohol/week: 0.0 standard drinks  . Drug use: No  . Sexual activity: Yes    Birth control/protection: Surgical

## 2020-12-18 ENCOUNTER — Ambulatory Visit (INDEPENDENT_AMBULATORY_CARE_PROVIDER_SITE_OTHER): Payer: BC Managed Care – PPO | Admitting: Orthopedic Surgery

## 2020-12-18 DIAGNOSIS — M205X2 Other deformities of toe(s) (acquired), left foot: Secondary | ICD-10-CM

## 2020-12-19 ENCOUNTER — Encounter: Payer: Self-pay | Admitting: Orthopedic Surgery

## 2020-12-19 NOTE — Progress Notes (Signed)
Office Visit Note   Patient: Jordan Reeves           Date of Birth: 01-Dec-1970           MRN: [id number] Visit Date: 12/18/2020              Requested by: Rory Percy, MD [place],  [place] [address] PCP: Rory Percy, MD  Chief Complaint  Patient presents with  . Left Foot - Routine Post Op    11/06/20 left foot weil osteotomy 2nd and 3rd MT PIP resection  2nd and 3rd toes       HPI: Patient is a 50 year old woman who presents 5 weeks status post left foot Weil osteotomies second and third metatarsals with PIP resection of the second and third toes she states she is doing well full weightbearing in regular shoes has no concerns.  Assessment & Plan: Visit Diagnoses:  1. Claw toe, acquired, left     Plan: Continue work on Achilles stretching recommended compression stockings  Follow-Up Instructions: Return if symptoms worsen or fail to improve.   Ortho Exam  Patient is alert, oriented, no adenopathy, well-dressed, normal affect, normal respiratory effort. Examination her toes are straight there is swelling of the second and third toes but no clawing.  There is no redness or cellulitis no signs of infection.  Imaging: No results found. No images are attached to the encounter.  Labs: Lab Results  Component Value Date   HGBA1C 6.7 (H) 11/08/2018   ESRSEDRATE 20 04/11/2018   REPTSTATUS 11/15/2018 FINAL 11/10/2018   GRAMSTAIN  11/10/2018    FEW WBC PRESENT, PREDOMINANTLY MONONUCLEAR NO ORGANISMS SEEN    CULT  11/10/2018    RARE PSEUDOMONAS AERUGINOSA NO ANAEROBES ISOLATED Performed at [facility], [place] [address] [place], [place] [address]    [place] 11/10/2018     Lab Results  Component Value Date   ALBUMIN 4.2 04/11/2018   ALBUMIN 4.2 12/29/2017   ALBUMIN 2.5 (L) 10/13/2017    No results found for: MG No results found for: VD25OH  No results found for: PREALBUMIN CBC EXTENDED Latest Ref Rng & Units 04/11/2018  12/29/2017 10/13/2017  WBC 4.0 - 10.5 K/uL 12.0(H) 8.6 18.0(H)  RBC 3.87 - 5.11 MIL/uL 4.45 4.62 3.50(L)  HGB 12.0 - 15.0 g/dL 13.3 13.6 10.6(L)  HCT 36.0 - 46.0 % 39.1 39.9 30.3(L)  PLT 150 - 400 K/uL 286 279 228  NEUTROABS 1.7 - 7.7 K/uL 8.7(H) 5.9 -  LYMPHSABS 0.7 - 4.0 K/uL 2.1 1.9 -     There is no height or weight on file to calculate BMI.  Orders:  No orders of the defined types were placed in this encounter.  No orders of the defined types were placed in this encounter.    Procedures: No procedures performed  Clinical Data: No additional findings.  ROS:  All other systems negative, except as noted in the HPI. Review of Systems  Objective: Vital Signs: There were no vitals taken for this visit.  Specialty Comments:  No specialty comments available.  PMFS History: Patient Active Problem List   Diagnosis Date Noted  . Claw toe, acquired, left   . Achilles tendon contracture, left   . Chronic hypertension complicating or reason for care during pregnancy, third trimester 10/11/2017  . 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 Diabetes mellitus without complication ([place]) 37/05/6268  . AMA (advanced maternal age) multigravida 35+ 05/01/2017  . Tachycardia 05/01/2017  .  Obesity (BMI 30-39.9) 02/05/2016  . Benign essential HTN 01/25/2015  . PVCs (premature ventricular contractions) 01/25/2015  . Heart palpitations 11/21/2014  . Thyroiditis    Past Medical History:  Diagnosis Date  . Benign essential HTN 01/25/2015  . Diabetes mellitus without complication ([address])    insuiln currently  . Dysrhythmia    PVCs last EKG NSR 09/2016  . Hypertension   . Hypothyroidism   . Infertility, female   . Obesity (BMI 30-39.9) 02/05/2016  . PCOS (polycystic ovarian syndrome)   . PVCs (premature ventricular contractions) 01/25/2015  . Thyroiditis    Hashimoto's now with hypothyroidism    Family History  Problem Relation Age of Onset  . Heart disease Paternal  Grandfather   . Lymphoma Paternal Grandmother   . Heart disease Maternal Grandmother   . Heart disease Maternal Grandfather   . Heart disease Father   . Diabetes Mother   . Hypertension Mother   . Heart disease Mother        heart murmu  . Hyperlipidemia Mother   . Breast cancer Maternal Aunt   . Cervical cancer Maternal Aunt     Past Surgical History:  Procedure Laterality Date  . ACHILLES TENDON LENGTHENING Left 03/22/2019   Procedure: LEFT ACHILLES TENDON Z-LENGTHENING;  Surgeon: Newt Minion, MD;  Location: [facility];  Service: Orthopedics;  Laterality: Left;  . AMPUTATION Left 11/10/2018   Procedure: AMPUTATION TOE 4TH;  Surgeon: Tyson Babinski, DPM;  [facility];  Service: Podiatry;  Laterality: Left;  . CESAREAN SECTION WITH BILATERAL TUBAL LIGATION N/A 10/12/2017   Procedure: CESAREAN SECTION WITH BILATERAL TUBAL LIGATION;  Surgeon: Delsa Bern, MD;  Location: [facility];  Service: Obstetrics;  Laterality: N/A;  . TONSILLECTOMY AND ADENOIDECTOMY    . TUBAL LIGATION    . WEIL OSTEOTOMY Left 11/06/2020   Procedure: LEFT FOOT 2ND AND 3RD METATARSAL WEIL OSTEOTOMY, PROXIMAL INTERPHALANGEAL RESECTION 2ND AND 3RD TOES;  Surgeon: Newt Minion, MD;  Location: [facility];  Service: Orthopedics;  Laterality: Left;  . WISDOM TOOTH EXTRACTION     Social History   Occupational History  . Not on file  Tobacco Use  . Smoking status: Never Smoker  . Smokeless tobacco: Never Used  Vaping Use  . Vaping Use: Never used  Substance and Sexual Activity  . Alcohol use: No    Alcohol/week: 0.0 standard drinks  . Drug use: No  . Sexual activity: Yes    Birth control/protection: Surgical

## 2021-04-30 ENCOUNTER — Encounter: Payer: Self-pay | Admitting: *Deleted

## 2021-06-06 ENCOUNTER — Ambulatory Visit: Payer: BC Managed Care – PPO

## 2021-06-10 ENCOUNTER — Other Ambulatory Visit: Payer: Self-pay

## 2021-06-10 ENCOUNTER — Encounter: Payer: Self-pay | Admitting: *Deleted

## 2021-06-10 ENCOUNTER — Ambulatory Visit (INDEPENDENT_AMBULATORY_CARE_PROVIDER_SITE_OTHER): Payer: Self-pay | Admitting: *Deleted

## 2021-06-10 VITALS — Ht 68.0 in | Wt 253.8 lb

## 2021-06-10 DIAGNOSIS — Z1211 Encounter for screening for malignant neoplasm of colon: Secondary | ICD-10-CM

## 2021-06-10 MED ORDER — NA SULFATE-K SULFATE-MG SULF 17.5-3.13-1.6 GM/177ML PO SOLN: 1.0000 | Freq: Once | ORAL | 0 refills | Status: AC

## 2021-06-10 NOTE — Progress Notes (Addendum)
Gastroenterology Pre-Procedure Review  Request Date: 06/10/2021 Requesting Physician: Lanelle Bal, PA-C @ [facility], no previous TCS  PATIENT REVIEW QUESTIONS: The patient responded to the following health history questions as indicated:    1. Diabetes Melitis: yes, type II  2. Joint replacements in the past 12 months: no 3. Major health problems in the past 3 months: no 4. Has an artificial valve or MVP: no 5. Has a defibrillator: no 6. Has been advised in past to take antibiotics in advance of a procedure like teeth cleaning: no 7. Family history of colon cancer: no 8. Alcohol Use: no 9. Illicit drug Use: no 10. History of sleep apnea: no 11. History of coronary artery or other vascular stents placed within the last 12 months: no 12. History of any prior anesthesia complications: yes, metabolize quickly, woke up during last procedure-wisdom teeth extraction at age 22 13. Body mass index is 38.59 kg/m.    MEDICATIONS & ALLERGIES:    Patient reports the following regarding taking any blood thinners:   Plavix? no Aspirin? no Coumadin? no Brilinta? no Xarelto? no Eliquis? no Pradaxa? no Savaysa? no Effient? no  Patient confirms/reports the following medications:  Current Outpatient Medications  Medication Sig Dispense Refill   furosemide (LASIX) 20 MG tablet Take 20 mg by mouth as needed. Takes only as needed.     labetalol (NORMODYNE) 200 MG tablet Take 2 tablets (400 mg total) by mouth 3 (three) times daily. (Patient taking differently: Take 200 mg by mouth 3 (three) times daily.)     levothyroxine (SYNTHROID, LEVOTHROID) 75 MCG tablet Take 88 mcg by mouth daily before breakfast.     metFORMIN (GLUCOPHAGE-XR) 500 MG 24 hr tablet Take 500 mg by mouth daily at 6 (six) AM. Takes 2 tablets (1000 mg total) at night time.     Multiple Vitamin (MULTIVITAMIN ADULT PO) Take by mouth daily.     NIFEdipine (PROCARDIA-XL/NIFEDICAL-XL) 30 MG 24 hr tablet at bedtime.      No current facility-administered medications for this visit.    Patient confirms/reports the following allergies:  No Known Allergies  No orders of the defined types were placed in this encounter.   AUTHORIZATION INFORMATION Primary Insurance: [place],  [place] #: [id number],  Group #: [id number] Pre-Cert / Auth required: No, not required per Bernestine Amass / Auth #: REF#: [id number]  SCHEDULE INFORMATION: Procedure has been scheduled as follows:  Date: 07/09/2021, Time: 9:30  Location: [facility] with Dr. Abbey Chatters  This Gastroenterology Pre-Precedure Review Form is being routed to the following provider(s): Aliene Altes, PA-C

## 2021-06-10 NOTE — Progress Notes (Signed)
Okay to schedule.  ASA 2.  Angie, we need to verify dose of metformin prior to scheduling so I can provide appropriate medication adjustments.  Is she taking 500 mg in the morning and 1000 mg in the evening?  Or only 1000 mg in the evening?

## 2021-06-11 NOTE — Progress Notes (Signed)
She only takes 1000 mg total in the evening.

## 2021-06-11 NOTE — Progress Notes (Addendum)
OK to schedule.  ASA 2.  Medication adjustments: Day of prep: Take one half dose of metformin (500 mg at bedtime). Day of procedure: No a.m. diabetes medications this morning.

## 2021-06-12 ENCOUNTER — Encounter: Payer: Self-pay | Admitting: *Deleted

## 2021-06-12 ENCOUNTER — Other Ambulatory Visit: Payer: Self-pay | Admitting: *Deleted

## 2021-06-12 DIAGNOSIS — Z1211 Encounter for screening for malignant neoplasm of colon: Secondary | ICD-10-CM

## 2021-06-12 NOTE — Progress Notes (Signed)
Mailed letter to pt with diabetes medication adjustments.   

## 2021-06-14 ENCOUNTER — Other Ambulatory Visit: Payer: Self-pay | Admitting: *Deleted

## 2021-06-14 NOTE — Progress Notes (Signed)
Spoke to [employer] at [facility] and was informed that no age criteria or pre-cert is required for procedure.  REF#: [id number]

## 2021-07-05 ENCOUNTER — Other Ambulatory Visit (HOSPITAL_COMMUNITY)
Admission: RE | Admit: 2021-07-05 | Discharge: 2021-07-05 | Disposition: A | Discharge status: Home / Self Care | Payer: BC Managed Care – PPO | Source: Ambulatory Visit | Attending: Internal Medicine | Admitting: Internal Medicine

## 2021-07-05 DIAGNOSIS — Z1211 Encounter for screening for malignant neoplasm of colon: Secondary | ICD-10-CM | POA: Diagnosis not present

## 2021-07-05 LAB — BASIC METABOLIC PANEL
Anion gap: 7 (ref 5–15)
BUN: 14 mg/dL (ref 6–20)
CO2: 26 mmol/L (ref 22–32)
Calcium: 9.4 mg/dL (ref 8.9–10.3)
Chloride: 103 mmol/L (ref 98–111)
Creatinine, Ser: 0.76 mg/dL (ref 0.44–1.00)
GFR, Estimated: >60 mL/min (ref >60)
Glucose, Bld: 127 mg/dL — ABNORMAL HIGH (ref 70–99)
Potassium: 4.3 mmol/L (ref 3.5–5.1)
Sodium: 136 mmol/L (ref 135–145)

## 2021-07-05 LAB — PREGNANCY, URINE: Preg Test, Ur: NEGATIVE

## 2021-07-09 ENCOUNTER — Ambulatory Visit (HOSPITAL_COMMUNITY): Payer: BC Managed Care – PPO | Admitting: Anesthesiology

## 2021-07-09 ENCOUNTER — Encounter (HOSPITAL_COMMUNITY)
Admission: RE | Disposition: A | Discharge status: Home / Self Care | Payer: Self-pay | Source: Home / Self Care | Attending: Internal Medicine

## 2021-07-09 ENCOUNTER — Ambulatory Visit (HOSPITAL_COMMUNITY)
Admission: RE | Admit: 2021-07-09 | Discharge: 2021-07-09 | Disposition: A | Discharge status: Home / Self Care | Payer: BC Managed Care – PPO | Attending: Internal Medicine | Admitting: Internal Medicine

## 2021-07-09 ENCOUNTER — Other Ambulatory Visit: Payer: Self-pay

## 2021-07-09 ENCOUNTER — Encounter (HOSPITAL_COMMUNITY): Payer: Self-pay

## 2021-07-09 DIAGNOSIS — E119 Type 2 diabetes mellitus without complications: Secondary | ICD-10-CM | POA: Diagnosis not present

## 2021-07-09 DIAGNOSIS — K648 Other hemorrhoids: Secondary | ICD-10-CM | POA: Insufficient documentation

## 2021-07-09 DIAGNOSIS — E669 Obesity, unspecified: Secondary | ICD-10-CM | POA: Diagnosis not present

## 2021-07-09 DIAGNOSIS — D120 Benign neoplasm of cecum: Secondary | ICD-10-CM | POA: Diagnosis not present

## 2021-07-09 DIAGNOSIS — Z1211 Encounter for screening for malignant neoplasm of colon: Secondary | ICD-10-CM | POA: Diagnosis not present

## 2021-07-09 DIAGNOSIS — I10 Essential (primary) hypertension: Secondary | ICD-10-CM | POA: Diagnosis not present

## 2021-07-09 DIAGNOSIS — D124 Benign neoplasm of descending colon: Secondary | ICD-10-CM | POA: Diagnosis not present

## 2021-07-09 DIAGNOSIS — Z6837 Body mass index (BMI) 37.0-37.9, adult: Secondary | ICD-10-CM | POA: Diagnosis not present

## 2021-07-09 HISTORY — PX: POLYPECTOMY: SHX5525

## 2021-07-09 HISTORY — PX: COLONOSCOPY WITH PROPOFOL: SHX5780

## 2021-07-09 LAB — GLUCOSE, CAPILLARY: Glucose-Capillary: 140 mg/dL — ABNORMAL HIGH (ref 70–99)

## 2021-07-09 SURGERY — COLONOSCOPY WITH PROPOFOL
Anesthesia: General

## 2021-07-09 MED ORDER — LIDOCAINE HCL (CARDIAC) PF 50 MG/5ML IV SOSY
PREFILLED_SYRINGE | INTRAVENOUS | Status: DC | PRN
  Administered 2021-07-09: 50 mg via INTRAVENOUS

## 2021-07-09 MED ORDER — LACTATED RINGERS IV SOLN: INTRAVENOUS | Status: DC

## 2021-07-09 MED ORDER — PROPOFOL 10 MG/ML IV BOLUS
INTRAVENOUS | Status: DC | PRN
  Administered 2021-07-09: 50 mg via INTRAVENOUS
  Administered 2021-07-09: 150 mg via INTRAVENOUS
  Administered 2021-07-09: 70 mg via INTRAVENOUS

## 2021-07-09 NOTE — H&P (Signed)
Primary Care Physician:  Caryl Bis, MD Primary Gastroenterologist:  Dr. Abbey Chatters  Pre-Procedure History & Physical: HPI:  Jordan Reeves is a 50 y.o. female is here for a colonoscopy for colon cancer screening purposes.  Patient denies any family history of colorectal cancer.  No melena or hematochezia.  No abdominal pain or unintentional weight loss.  No change in bowel habits.  Overall feels well from a GI standpoint.  Past Medical History:  Diagnosis Date   Benign essential HTN 01/25/2015   Diabetes mellitus without complication ([place])    insuiln currently   Dysrhythmia    PVCs last EKG NSR 09/2016   Hypertension    Hypothyroidism    Infertility, female    Obesity (BMI 30-39.9) 02/05/2016   PCOS (polycystic ovarian syndrome)    PVCs (premature ventricular contractions) 01/25/2015   Thyroiditis    Hashimoto's now with hypothyroidism    Past Surgical History:  Procedure Laterality Date   ACHILLES TENDON LENGTHENING Left 03/22/2019   Procedure: LEFT ACHILLES TENDON Z-LENGTHENING;  Surgeon: Newt Minion, MD;  Location: [facility];  Service: Orthopedics;  Laterality: Left;   AMPUTATION Left 11/10/2018   Procedure: AMPUTATION TOE 4TH;  Surgeon: Tyson Babinski, DPM;  Location: [facility];  Service: Podiatry;  Laterality: Left;   CESAREAN SECTION WITH BILATERAL TUBAL LIGATION N/A 10/12/2017   Procedure: CESAREAN SECTION WITH BILATERAL TUBAL LIGATION;  Surgeon: Delsa Bern, MD;  Location: [facility];  Service: Obstetrics;  Laterality: N/A;   TONSILLECTOMY AND ADENOIDECTOMY     TUBAL LIGATION     WEIL OSTEOTOMY Left 11/06/2020   Procedure: LEFT FOOT 2ND AND 3RD METATARSAL WEIL OSTEOTOMY, PROXIMAL INTERPHALANGEAL RESECTION 2ND AND 3RD TOES;  Surgeon: Newt Minion, MD;  Location: [facility];  Service: Orthopedics;  Laterality: Left;   WISDOM TOOTH EXTRACTION      Prior to Admission medications   Medication Sig Start Date End Date Taking?  Authorizing Provider  labetalol (NORMODYNE) 200 MG tablet Take 2 tablets (400 mg total) by mouth 3 (three) times daily. Patient taking differently: Take 300 mg by mouth 3 (three) times daily. 04/29/17  Yes Dunn, Nedra Hai, PA-C  levothyroxine (SYNTHROID) 88 MCG tablet Take 88 mcg by mouth daily before breakfast.   Yes [provider]  metFORMIN (GLUCOPHAGE-XR) 500 MG 24 hr tablet Take 1,000 mg by mouth at bedtime. 05/24/21  Yes [provider]  Multiple Vitamin (MULTIVITAMIN ADULT PO) Take 1 tablet by mouth daily.   Yes [provider]  NIFEdipine (PROCARDIA-XL/NIFEDICAL-XL) 30 MG 24 hr tablet Take 30 mg by mouth at bedtime. 03/14/21  Yes [provider]  furosemide (LASIX) 20 MG tablet Take 20 mg by mouth daily as needed for fluid. 04/22/21   [provider]    Allergies as of 06/12/2021   (No Known Allergies)    Family History  Problem Relation Age of Onset   Heart disease Paternal Grandfather    Lymphoma Paternal Grandmother    Heart disease Maternal Grandmother    Heart disease Maternal Grandfather    Heart disease Father    Diabetes Mother    Hypertension Mother    Heart disease Mother        heart murmu   Hyperlipidemia Mother    Breast cancer Maternal Aunt    Cervical cancer Maternal Aunt     Social History   Socioeconomic History   Marital status: Married    Spouse name: Not on file   Number  of children: Not on file   Years of education: Not on file   Highest education level: Not on file  Occupational History   Not on file  Tobacco Use   Smoking status: Never   Smokeless tobacco: Never  Vaping Use   Vaping Use: Never used  Substance and Sexual Activity   Alcohol use: No    Alcohol/week: 0.0 standard drinks   Drug use: No   Sexual activity: Yes    Birth control/protection: Surgical  Other Topics Concern   Not on file  Social History Narrative   Not on file   Social Determinants of Health   Financial Resource  Strain: Not on file  Food Insecurity: Not on file  Transportation Needs: Not on file  Physical Activity: Not on file  Stress: Not on file  Social Connections: Not on file  Intimate Partner Violence: Not on file    Review of Systems: See HPI, otherwise negative ROS  Physical Exam: Vital signs in last 24 hours: Temp:  [98.6 F (37 C)] 98.6 F (37 C) (11/15 0826) Resp:  [15] 15 (11/15 0826) BP: (142)/(79) 142/79 (11/15 0826) SpO2:  [99 %] 99 % (11/15 0826) Weight:  [112.5 kg] 112.5 kg (11/15 0826)   General:   Alert,  Well-developed, well-nourished, pleasant and cooperative in NAD Head:  Normocephalic and atraumatic. Eyes:  Sclera clear, no icterus.   Conjunctiva pink. Ears:  Normal auditory acuity. Nose:  No deformity, discharge,  or lesions. Mouth:  No deformity or lesions, dentition normal. Neck:  Supple; no masses or thyromegaly. Lungs:  Clear throughout to auscultation.   No wheezes, crackles, or rhonchi. No acute distress. Heart:  Regular rate and rhythm; no murmurs, clicks, rubs,  or gallops. Abdomen:  Soft, nontender and nondistended. No masses, hepatosplenomegaly or hernias noted. Normal bowel sounds, without guarding, and without rebound.   Msk:  Symmetrical without gross deformities. Normal posture. Extremities:  Without clubbing or edema. Neurologic:  Alert and  oriented x4;  grossly normal neurologically. Skin:  Intact without significant lesions or rashes. Cervical Nodes:  No significant cervical adenopathy. Psych:  Alert and cooperative. Normal mood and affect.  Impression/Plan: Jordan Reeves is here for a colonoscopy to be performed for colon cancer screening purposes.  The risks of the procedure including infection, bleed, or perforation as well as benefits, limitations, alternatives and imponderables have been reviewed with the patient. Questions have been answered. All parties agreeable.

## 2021-07-09 NOTE — Transfer of Care (Signed)
Immediate Anesthesia Transfer of Care Note  Patient: Jordan Reeves  Procedure(s) Performed: COLONOSCOPY WITH PROPOFOL POLYPECTOMY  Patient Location: Endoscopy Unit  Anesthesia Type:General  Level of Consciousness: awake and patient cooperative  Airway & Oxygen Therapy: Patient Spontanous Breathing  Post-op Assessment: Report given to RN and Post -op Vital signs reviewed and stable  Post vital signs: Reviewed and stable  Last Vitals:  Vitals Value Taken Time  BP    Temp 37 C 07/09/21 1008  Pulse    Resp    SpO2      Last Pain:  Vitals:   07/09/21 1008  TempSrc: Oral  PainSc:       Patients Stated Pain Goal: 10 (78/29/56 2130)  Complications: No notable events documented.

## 2021-07-09 NOTE — Anesthesia Postprocedure Evaluation (Signed)
Anesthesia Post Note  Patient: Jordan Reeves  Procedure(s) Performed: COLONOSCOPY WITH PROPOFOL POLYPECTOMY  Patient location during evaluation: Phase II Anesthesia Type: General Level of consciousness: awake Pain management: pain level controlled Vital Signs Assessment: post-procedure vital signs reviewed and stable Respiratory status: spontaneous breathing and respiratory function stable Cardiovascular status: blood pressure returned to baseline and stable Postop Assessment: no headache and no apparent nausea or vomiting Anesthetic complications: no Comments: Late entry   No notable events documented.   Last Vitals:  Vitals:   07/09/21 0826 07/09/21 1008  BP: (!) 142/79 94/62  Resp: 15 17  Temp: 37 C 37 C  SpO2: 99% 98%    Last Pain:  Vitals:   07/09/21 1008  TempSrc: Oral  PainSc: 0-No pain                 Louann Sjogren

## 2021-07-09 NOTE — Anesthesia Procedure Notes (Signed)
Date/Time: 07/09/2021 9:48 AM Performed by: Vista Deck, CRNA Pre-anesthesia Checklist: Patient identified, Emergency Drugs available, Suction available, Timeout performed and Patient being monitored Patient Re-evaluated:Patient Re-evaluated prior to induction Oxygen Delivery Method: Nasal Cannula

## 2021-07-09 NOTE — Discharge Instructions (Addendum)
  Colonoscopy Discharge Instructions  Read the instructions outlined below and refer to this sheet in the next few weeks. These discharge instructions provide you with general information on caring for yourself after you leave the hospital. Your doctor may also give you specific instructions. While your treatment has been planned according to the most current medical practices available, unavoidable complications occasionally occur.   ACTIVITY You may resume your regular activity, but move at a slower pace for the next 24 hours.  Take frequent rest periods for the next 24 hours.  Walking will help get rid of the air and reduce the bloated feeling in your belly (abdomen).  No driving for 24 hours (because of the medicine (anesthesia) used during the test).   Do not sign any important legal documents or operate any machinery for 24 hours (because of the anesthesia used during the test).  NUTRITION Drink plenty of fluids.  You may resume your normal diet as instructed by your doctor.  Begin with a light meal and progress to your normal diet. Heavy or fried foods are harder to digest and may make you feel sick to your stomach (nauseated).  Avoid alcoholic beverages for 24 hours or as instructed.  MEDICATIONS You may resume your normal medications unless your doctor tells you otherwise.  WHAT YOU CAN EXPECT TODAY Some feelings of bloating in the abdomen.  Passage of more gas than usual.  Spotting of blood in your stool or on the toilet paper.  IF YOU HAD POLYPS REMOVED DURING THE COLONOSCOPY: No aspirin products for 7 days or as instructed.  No alcohol for 7 days or as instructed.  Eat a soft diet for the next 24 hours.  FINDING OUT THE RESULTS OF YOUR TEST Not all test results are available during your visit. If your test results are not back during the visit, make an appointment with your caregiver to find out the results. Do not assume everything is normal if you have not heard from your  caregiver or the medical facility. It is important for you to follow up on all of your test results.  SEEK IMMEDIATE MEDICAL ATTENTION IF: You have more than a spotting of blood in your stool.  Your belly is swollen (abdominal distention).  You are nauseated or vomiting.  You have a temperature over 101.  You have abdominal pain or discomfort that is severe or gets worse throughout the day.   Your colonoscopy revealed 2 polyp(s) which I removed successfully. Await pathology results, my office will contact you. I recommend repeating colonoscopy in 5 years for surveillance purposes. Otherwise follow up with GI as needed.    I hope you have a great rest of your week!  Charles K. Carver, D.O. Gastroenterology and Hepatology [facility]  

## 2021-07-09 NOTE — Anesthesia Preprocedure Evaluation (Signed)
Anesthesia Evaluation  Patient identified by MRN, date of birth, ID band Patient awake    Reviewed: Allergy & Precautions, H&P , NPO status , Patient's Chart, lab work & pertinent test results, reviewed documented beta blocker date and time   Airway Mallampati: II  TM Distance: >3 FB Neck ROM: full    Dental no notable dental hx.    Pulmonary neg pulmonary ROS,    Pulmonary exam normal breath sounds clear to auscultation       Cardiovascular Exercise Tolerance: Good hypertension, negative cardio ROS   Rhythm:regular Rate:Normal     Neuro/Psych negative neurological ROS  negative psych ROS   GI/Hepatic negative GI ROS, Neg liver ROS,   Endo/Other  diabetes, Type 2Hypothyroidism   Renal/GU negative Renal ROS  negative genitourinary   Musculoskeletal   Abdominal   Peds  Hematology negative hematology ROS (+)   Anesthesia Other Findings   Reproductive/Obstetrics negative OB ROS                             Anesthesia Physical Anesthesia Plan  ASA: 2  Anesthesia Plan: General   Post-op Pain Management:    Induction:   PONV Risk Score and Plan: Propofol infusion  Airway Management Planned:   Additional Equipment:   Intra-op Plan:   Post-operative Plan:   Informed Consent: I have reviewed the patients History and Physical, chart, labs and discussed the procedure including the risks, benefits and alternatives for the proposed anesthesia with the patient or authorized representative who has indicated his/her understanding and acceptance.     Dental Advisory Given  Plan Discussed with: CRNA  Anesthesia Plan Comments:         Anesthesia Quick Evaluation

## 2021-07-09 NOTE — Op Note (Signed)
[facility] Patient Name: Jordan Reeves Procedure Date: 07/09/2021 9:41 AM MRN: [id number] Date of Birth: [birth year] Attending MD: Elon Alas. Abbey Chatters DO CSN: [id number] Age: 50 Admit Type: Outpatient Procedure:                Colonoscopy Indications:              Screening for colorectal malignant neoplasm Providers:                Elon Alas. Abbey Chatters, DO, Janeece Riggers, RN, Kristine L.                            Risa Grill, Technician Referring MD:              Medicines:                See the Anesthesia note for documentation of the                            administered medications Complications:            No immediate complications. Estimated Blood Loss:     Estimated blood loss was minimal. Procedure:                Pre-Anesthesia Assessment:                           - The anesthesia plan was to use monitored                            anesthesia care (MAC).                           After obtaining informed consent, the colonoscope                            was passed under direct vision. Throughout the                            procedure, the patient's blood pressure, pulse, and                            oxygen saturations were monitored continuously. The                            [id number]-HQ190L ([id number]) scope was introduced through                            the anus and advanced to the the cecum, identified                            by appendiceal orifice and ileocecal valve. The                            colonoscopy was performed without difficulty. The                            patient tolerated the procedure  well. The quality                            of the bowel preparation was evaluated using the                            BBPS [facility] Bowel Preparation Scale) with scores                            of: Right Colon = 3, Transverse Colon = 3 and Left                            Colon = 3 (entire mucosa seen well with no residual                             staining, small fragments of stool or opaque                            liquid). The total BBPS score equals 9. Scope In: 9:55:15 AM Scope Out: 10:05:02 AM Scope Withdrawal Time: 0 hours 7 minutes 54 seconds  Total Procedure Duration: 0 hours 9 minutes 47 seconds  Findings:      The perianal and digital rectal examinations were normal.      Non-bleeding internal hemorrhoids were found during endoscopy.      A 6 mm polyp was found in the cecum. The polyp was sessile. The polyp       was removed with a cold snare. Resection and retrieval were complete.      A 7 mm polyp was found in the descending colon. The polyp was sessile.       The polyp was removed with a cold snare. Resection and retrieval were       complete.      The exam was otherwise without abnormality. Impression:               - Non-bleeding internal hemorrhoids.                           - One 6 mm polyp in the cecum, removed with a cold                            snare. Resected and retrieved.                           - One 7 mm polyp in the descending colon, removed                            with a cold snare. Resected and retrieved.                           - The examination was otherwise normal. Moderate Sedation:      Per Anesthesia Care Recommendation:           - Patient has a contact number available for  emergencies. The signs and symptoms of potential                            delayed complications were discussed with the                            patient. Return to normal activities tomorrow.                            Written discharge instructions were provided to the                            patient.                           - Resume previous diet.                           - Continue present medications.                           - Await pathology results.                           - Repeat colonoscopy in 5 years for surveillance.                           - Return to GI  clinic PRN. Procedure Code(s):        --- Professional ---                           [phone], Colonoscopy, flexible; with removal of                            tumor(s), polyp(s), or other lesion(s) by snare                            technique Diagnosis Code(s):        --- Professional ---                           Z12.11, Encounter for screening for malignant                            neoplasm of colon                           K63.5, Polyp of colon                           K64.8, Other hemorrhoids CPT copyright 2019 American Medical Association. All rights reserved. The codes documented in this report are preliminary and upon coder review may  be revised to meet current compliance requirements. Elon Alas. Abbey Chatters, DO [place] Abbey Chatters, DO 07/09/2021 10:07:00 AM This report has been signed electronically. Number of Addenda: 0

## 2021-07-10 LAB — SURGICAL PATHOLOGY

## 2021-07-11 ENCOUNTER — Encounter (HOSPITAL_COMMUNITY): Payer: Self-pay | Admitting: Internal Medicine

## 2022-06-24 ENCOUNTER — Ambulatory Visit (INDEPENDENT_AMBULATORY_CARE_PROVIDER_SITE_OTHER): Payer: BC Managed Care – PPO | Admitting: Family

## 2022-06-24 ENCOUNTER — Ambulatory Visit (INDEPENDENT_AMBULATORY_CARE_PROVIDER_SITE_OTHER): Payer: BC Managed Care – PPO

## 2022-06-24 ENCOUNTER — Encounter: Payer: Self-pay | Admitting: Family

## 2022-06-24 DIAGNOSIS — M79671 Pain in right foot: Secondary | ICD-10-CM

## 2022-06-24 DIAGNOSIS — L03031 Cellulitis of right toe: Secondary | ICD-10-CM | POA: Diagnosis not present

## 2022-06-24 DIAGNOSIS — M205X1 Other deformities of toe(s) (acquired), right foot: Secondary | ICD-10-CM

## 2022-06-24 DIAGNOSIS — L02611 Cutaneous abscess of right foot: Secondary | ICD-10-CM | POA: Diagnosis not present

## 2022-06-24 MED ORDER — SULFAMETHOXAZOLE-TRIMETHOPRIM 800-160 MG PO TABS: 1.0000 | ORAL_TABLET | Freq: Two times a day (BID) | ORAL | 0 refills | Status: DC

## 2022-06-24 NOTE — Progress Notes (Signed)
Office Visit Note   Patient: Jordan Reeves           Date of Birth: [birth year]           MRN: [id number] Visit Date: 06/24/2022              Requested by: Caryl Bis, MD [place],  [place] [address] PCP: Caryl Bis, MD  Chief Complaint  Patient presents with   Right Foot - Pain, Wound Check      HPI: The patient is a 51 year old woman who is concerned for infection in her right third toe she has had issues with this toe for many years now she states she has neuropathy and typically cannot feel her feet at all but over the last 2 weeks or so the third toe has become more painful and more swollen it is somewhat swollen at baseline she states she has had a few drops of purulent milky drainage from the tip of her toe she does have a hammertoe and crossover deformity.  Assessment & Plan: Visit Diagnoses:  1. Pain in right foot     Plan: We will trial her on a course of Bactrim follow-up in 2 weeks.  Did discuss possibility of amputation of the third toe radiographs not definitive for osteomyelitis.  Follow-Up Instructions: No follow-ups on file.   Ortho Exam  Patient is alert, oriented, no adenopathy, well-dressed, normal affect, normal respiratory effort. On examination of the third toe there is flexible clawing and edema there is mild erythema no cellulitis no ascending cellulitis she does have a palpable dorsalis pedis pulse.  The third toe with callused ulceration in the third webspace no active drainage today  Imaging: No results found. No images are attached to the encounter.  Labs: Lab Results  Component Value Date   HGBA1C 6.7 (H) 11/08/2018   ESRSEDRATE 20 04/11/2018   REPTSTATUS 11/15/2018 FINAL 11/10/2018   GRAMSTAIN  11/10/2018    FEW WBC PRESENT, PREDOMINANTLY MONONUCLEAR NO ORGANISMS SEEN    CULT  11/10/2018    RARE PSEUDOMONAS AERUGINOSA NO ANAEROBES ISOLATED Performed at [facility], [place] [address], [place], [place]  [address]    LABORGA PSEUDOMONAS AERUGINOSA 11/10/2018     Lab Results  Component Value Date   ALBUMIN 4.2 04/11/2018   ALBUMIN 4.2 12/29/2017   ALBUMIN 2.5 (L) 10/13/2017    No results found for: "MG" No results found for: "VD25OH"  No results found for: "PREALBUMIN"    Latest Ref Rng & Units 04/11/2018    2:33 AM 12/29/2017    7:43 PM 10/13/2017    5:29 AM  CBC EXTENDED  WBC 4.0 - 10.5 K/uL 12.0  8.6  18.0   RBC 3.87 - 5.11 MIL/uL 4.45  4.62  3.50   Hemoglobin 12.0 - 15.0 g/dL 13.3  13.6  10.6   HCT 36.0 - 46.0 % 39.1  39.9  30.3   Platelets 150 - 400 K/uL 286  279  228   NEUT# 1.7 - 7.7 K/uL 8.7  5.9    Lymph# 0.7 - 4.0 K/uL 2.1  1.9       There is no height or weight on file to calculate BMI.  Orders:  Orders Placed This Encounter  Procedures   XR Foot Complete Right   Meds ordered this encounter  Medications   sulfamethoxazole-trimethoprim (BACTRIM DS) 800-160 MG tablet    Sig: Take 1 tablet by mouth 2 (two) times daily.  Dispense:  20 tablet    Refill:  0     Procedures: No procedures performed  Clinical Data: No additional findings.  ROS:  All other systems negative, except as noted in the HPI. Review of Systems  Constitutional:  Negative for chills and fever.    Objective: Vital Signs: There were no vitals taken for this visit.  Specialty Comments:  No specialty comments available.  PMFS History: Patient Active Problem List   Diagnosis Date Noted   Claw toe, acquired, left    Achilles tendon contracture, left    Chronic hypertension complicating or reason for care during pregnancy, third trimester 10/11/2017   History of recurrent miscarriages--x 4 05/01/2017   Diabetes mellitus without complication ([place]) 75/05/2584   AMA (advanced maternal age) multigravida 35+ 05/01/2017   Tachycardia 05/01/2017   Obesity (BMI 30-39.9) 02/05/2016   Benign essential HTN 01/25/2015   PVCs (premature ventricular contractions) 01/25/2015   Heart  palpitations 11/21/2014   Thyroiditis    Past Medical History:  Diagnosis Date   Benign essential HTN 01/25/2015   Diabetes mellitus without complication ([place])    insuiln currently   Dysrhythmia    PVCs last EKG NSR 09/2016   Hypertension    Hypothyroidism    Infertility, female    Obesity (BMI 30-39.9) 02/05/2016   PCOS (polycystic ovarian syndrome)    PVCs (premature ventricular contractions) 01/25/2015   Thyroiditis    Hashimoto's now with hypothyroidism    Family History  Problem Relation Age of Onset   Heart disease Paternal Grandfather    Lymphoma Paternal Grandmother    Heart disease Maternal Grandmother    Heart disease Maternal Grandfather    Heart disease Father    Diabetes Mother    Hypertension Mother    Heart disease Mother        heart murmu   Hyperlipidemia Mother    Breast cancer Maternal Aunt    Cervical cancer Maternal Aunt     Past Surgical History:  Procedure Laterality Date   ACHILLES TENDON LENGTHENING Left 03/22/2019   Procedure: LEFT ACHILLES TENDON Z-LENGTHENING;  Surgeon: Newt Minion, MD;  Location: [facility];  Service: Orthopedics;  Laterality: Left;   AMPUTATION Left 11/10/2018   Procedure: AMPUTATION TOE 4TH;  Surgeon: Tyson Babinski, DPM;  [facility];  Service: Podiatry;  Laterality: Left;   CESAREAN SECTION WITH BILATERAL TUBAL LIGATION N/A 10/12/2017   Procedure: CESAREAN SECTION WITH BILATERAL TUBAL LIGATION;  Surgeon: Delsa Bern, MD;  Location: [employer];  Service: Obstetrics;  Laterality: N/A;   COLONOSCOPY WITH PROPOFOL N/A 07/09/2021   Procedure: COLONOSCOPY WITH PROPOFOL;  Surgeon: Eloise Harman, DO;  [facility];  Service: Endoscopy;  Laterality: N/A;  9:30 / ASA II   POLYPECTOMY  07/09/2021   Procedure: POLYPECTOMY;  Surgeon: Eloise Harman, DO;  [facility];  Service: Endoscopy;;   TONSILLECTOMY AND ADENOIDECTOMY     TUBAL LIGATION     WEIL OSTEOTOMY Left 11/06/2020    Procedure: LEFT FOOT 2ND AND 3RD METATARSAL WEIL OSTEOTOMY, PROXIMAL INTERPHALANGEAL RESECTION 2ND AND 3RD TOES;  Surgeon: Newt Minion, MD;  Location: [place] [facility];  Service: Orthopedics;  Laterality: Left;   WISDOM TOOTH EXTRACTION     Social History   Occupational History   Not on file  Tobacco Use   Smoking status: Never   Smokeless tobacco: Never  Vaping Use   Vaping Use: Never used  Substance and Sexual Activity  Alcohol use: No    Alcohol/week: 0.0 standard drinks of alcohol   Drug use: No   Sexual activity: Yes    Birth control/protection: Surgical

## 2022-07-08 ENCOUNTER — Ambulatory Visit (INDEPENDENT_AMBULATORY_CARE_PROVIDER_SITE_OTHER): Payer: BC Managed Care – PPO | Admitting: Family

## 2022-07-08 ENCOUNTER — Ambulatory Visit: Payer: Self-pay

## 2022-07-08 DIAGNOSIS — L03031 Cellulitis of right toe: Secondary | ICD-10-CM | POA: Diagnosis not present

## 2022-07-08 DIAGNOSIS — L02611 Cutaneous abscess of right foot: Secondary | ICD-10-CM

## 2022-07-09 ENCOUNTER — Encounter: Payer: Self-pay | Admitting: Family

## 2022-07-09 NOTE — Progress Notes (Signed)
Office Visit Note   Patient: Jordan Reeves           Date of Birth: 07-07-1971           MRN: [id number] Visit Date: 07/08/2022              Requested by: Caryl Bis, MD [place],  [place] [address] PCP: Caryl Bis, MD  Chief Complaint  Patient presents with   Right Foot - Follow-up      HPI: The patient is a 51 year old woman who presents in follow-up she was last seen about 2 weeks ago for concern of infection in the right third toe she has had issues with this toe off and on for many years it is chronically swollen.  She completed a course of Bactrim 3 days ago  Today she is pleased with the resolution of the erythema and pain the wound is no longer open she does continue to be issues concerned about the hammertoes and crossover deformity of the toes but at this time is pleased with the resolution and would like to hold off on any intervention  Assessment & Plan: Visit Diagnoses:  1. Cellulitis and abscess of toe, right     Plan: Cellulitis is resolved.  There is no longer an open ulcer.  No definitive osteomyelitis.  At this time we will follow her conservatively.  She would like to follow-up on an as-needed basis she was instructed on return precautions  Follow-Up Instructions: Return if symptoms worsen or fail to improve.   Ortho Exam  Patient is alert, oriented, no adenopathy, well-dressed, normal affect, normal respiratory effort. On examination of the right third toe she does have flexible clawing there is mild edema no erythema no warmth there is callused ulceration which was debrided of nonviable tissue there is no open area or drainage  Imaging: No results found. No images are attached to the encounter.  Labs: Lab Results  Component Value Date   HGBA1C 6.7 (H) 11/08/2018   ESRSEDRATE 20 04/11/2018   REPTSTATUS 11/15/2018 FINAL 11/10/2018   GRAMSTAIN  11/10/2018    FEW WBC PRESENT, PREDOMINANTLY MONONUCLEAR NO ORGANISMS SEEN    CULT   11/10/2018    RARE PSEUDOMONAS AERUGINOSA NO ANAEROBES ISOLATED Performed at [facility], [place] [address], [place], [place] [address]    LABORGA PSEUDOMONAS AERUGINOSA 11/10/2018     Lab Results  Component Value Date   ALBUMIN 4.2 04/11/2018   ALBUMIN 4.2 12/29/2017   ALBUMIN 2.5 (L) 10/13/2017    No results found for: "MG" No results found for: "VD25OH"  No results found for: "PREALBUMIN"    Latest Ref Rng & Units 04/11/2018    2:33 AM 12/29/2017    7:43 PM 10/13/2017    5:29 AM  CBC EXTENDED  WBC 4.0 - 10.5 K/uL 12.0  8.6  18.0   RBC 3.87 - 5.11 MIL/uL 4.45  4.62  3.50   Hemoglobin 12.0 - 15.0 g/dL 13.3  13.6  10.6   HCT 36.0 - 46.0 % 39.1  39.9  30.3   Platelets 150 - 400 K/uL 286  279  228   NEUT# 1.7 - 7.7 K/uL 8.7  5.9    Lymph# 0.7 - 4.0 K/uL 2.1  1.9       There is no height or weight on file to calculate BMI.  Orders:  Orders Placed This Encounter  Procedures   XR Foot Complete Right   No orders  of the defined types were placed in this encounter.    Procedures: No procedures performed  Clinical Data: No additional findings.  ROS:  All other systems negative, except as noted in the HPI. Review of Systems  Objective: Vital Signs: There were no vitals taken for this visit.  Specialty Comments:  No specialty comments available.  PMFS History: Patient Active Problem List   Diagnosis Date Noted   Claw toe, acquired, left    Achilles tendon contracture, left    Chronic hypertension complicating or reason for care during pregnancy, third trimester 10/11/2017   History of recurrent miscarriages--x 4 05/01/2017   Diabetes mellitus without complication ([place]) 25/95/6387   AMA (advanced maternal age) multigravida 35+ 05/01/2017   Tachycardia 05/01/2017   Obesity (BMI 30-39.9) 02/05/2016   Benign essential HTN 01/25/2015   PVCs (premature ventricular contractions) 01/25/2015   Heart palpitations 11/21/2014   Thyroiditis    Past Medical  History:  Diagnosis Date   Benign essential HTN 01/25/2015   Diabetes mellitus without complication ([place])    insuiln currently   Dysrhythmia    PVCs last EKG NSR 09/2016   Hypertension    Hypothyroidism    Infertility, female    Obesity (BMI 30-39.9) 02/05/2016   PCOS (polycystic ovarian syndrome)    PVCs (premature ventricular contractions) 01/25/2015   Thyroiditis    Hashimoto's now with hypothyroidism    Family History  Problem Relation Age of Onset   Heart disease Paternal Grandfather    Lymphoma Paternal Grandmother    Heart disease Maternal Grandmother    Heart disease Maternal Grandfather    Heart disease Father    Diabetes Mother    Hypertension Mother    Heart disease Mother        heart murmu   Hyperlipidemia Mother    Breast cancer Maternal Aunt    Cervical cancer Maternal Aunt     Past Surgical History:  Procedure Laterality Date   ACHILLES TENDON LENGTHENING Left 03/22/2019   Procedure: LEFT ACHILLES TENDON Z-LENGTHENING;  Surgeon: Newt Minion, MD;  Location: [facility];  Service: Orthopedics;  Laterality: Left;   AMPUTATION Left 11/10/2018   Procedure: AMPUTATION TOE 4TH;  Surgeon: Tyson Babinski, DPM;  [facility];  Service: Podiatry;  Laterality: Left;   CESAREAN SECTION WITH BILATERAL TUBAL LIGATION N/A 10/12/2017   Procedure: CESAREAN SECTION WITH BILATERAL TUBAL LIGATION;  Surgeon: Delsa Bern, MD;  Location: [facility];  Service: Obstetrics;  Laterality: N/A;   COLONOSCOPY WITH PROPOFOL N/A 07/09/2021   Procedure: COLONOSCOPY WITH PROPOFOL;  Surgeon: Eloise Harman, DO;  [facility];  Service: Endoscopy;  Laterality: N/A;  9:30 / ASA II   POLYPECTOMY  07/09/2021   Procedure: POLYPECTOMY;  Surgeon: Eloise Harman, DO;  [facility];  Service: Endoscopy;;   TONSILLECTOMY AND ADENOIDECTOMY     TUBAL LIGATION     WEIL OSTEOTOMY Left 11/06/2020   Procedure: LEFT FOOT 2ND AND 3RD METATARSAL WEIL  OSTEOTOMY, PROXIMAL INTERPHALANGEAL RESECTION 2ND AND 3RD TOES;  Surgeon: Newt Minion, MD;  Location: [facility];  Service: Orthopedics;  Laterality: Left;   WISDOM TOOTH EXTRACTION     Social History   Occupational History   Not on file  Tobacco Use   Smoking status: Never   Smokeless tobacco: Never  Vaping Use   Vaping Use: Never used  Substance and Sexual Activity   Alcohol use: No    Alcohol/week: 0.0 standard drinks of  alcohol   Drug use: No   Sexual activity: Yes    Birth control/protection: Surgical

## 2022-08-13 ENCOUNTER — Encounter: Payer: Self-pay | Admitting: Family

## 2022-08-13 ENCOUNTER — Ambulatory Visit (INDEPENDENT_AMBULATORY_CARE_PROVIDER_SITE_OTHER): Payer: BC Managed Care – PPO | Admitting: Family

## 2022-08-13 DIAGNOSIS — E119 Type 2 diabetes mellitus without complications: Secondary | ICD-10-CM | POA: Diagnosis not present

## 2022-08-13 DIAGNOSIS — L02612 Cutaneous abscess of left foot: Secondary | ICD-10-CM | POA: Diagnosis not present

## 2022-08-13 MED ORDER — AMOXICILLIN-POT CLAVULANATE 500-125 MG PO TABS: 1.0000 | ORAL_TABLET | Freq: Three times a day (TID) | ORAL | 0 refills | Status: DC

## 2022-08-13 MED ORDER — MUPIROCIN 2 % EX OINT: 1.0000 | TOPICAL_OINTMENT | Freq: Every day | CUTANEOUS | 0 refills | Status: AC

## 2022-08-13 NOTE — Addendum Note (Signed)
Addended by: Suzan Slick on: 08/13/2022 03:18 PM   Modules accepted: Orders

## 2022-08-13 NOTE — Progress Notes (Signed)
Office Visit Note   Patient: Jordan Reeves           Date of Birth: 24-Mar-1971           MRN: [id number] Visit Date: 08/13/2022              Requested by: Caryl Bis, MD [place],  [place] [address] PCP: Caryl Bis, MD  Chief Complaint  Patient presents with   Right Foot - Wound Check      HPI: The patient is a 51 year old woman who presents today concern for new possible wound with a crack on the plantar aspect of the left foot.  Notes that her fifth toe became red and swollen yesterday and was much more so this morning has not noticed any drainage no fever no chills  Assessment & Plan: Visit Diagnoses:  1. Abscess of fifth toe, left   2. Diabetes mellitus without complication ([place])     Plan: Continue daily dose of cleansing.  Dry dressings we will place her on a course of antibiotics.  Call in mupirocin for the little toe  Follow-Up Instructions: Return in about 2 weeks (around 08/27/2022).   Ortho Exam  Patient is alert, oriented, no adenopathy, well-dressed, normal affect, normal respiratory effort. On examination of the left foot she does have callused ulceration over the lateral border of the fifth toe with central ulceration after debridement there was purulence expressed.  There is some surrounding erythema.  No ascending cellulitis does have a palpable dorsalis pedis pulse.  To the plantar aspect of the same foot she has a fissure which is about 5 cm in length this was debrided of some of the nonviable hyperkeratotic tissue back to viable tissue.  There is no drainage or granulation.  Imaging: No results found. No images are attached to the encounter.  Labs: Lab Results  Component Value Date   HGBA1C 6.7 (H) 11/08/2018   ESRSEDRATE 20 04/11/2018   REPTSTATUS 11/15/2018 FINAL 11/10/2018   GRAMSTAIN  11/10/2018    FEW WBC PRESENT, PREDOMINANTLY MONONUCLEAR NO ORGANISMS SEEN    CULT  11/10/2018    RARE PSEUDOMONAS AERUGINOSA NO ANAEROBES  ISOLATED Performed at [facility], [place] [address], [place], [place] [address]    LABORGA PSEUDOMONAS AERUGINOSA 11/10/2018     Lab Results  Component Value Date   ALBUMIN 4.2 04/11/2018   ALBUMIN 4.2 12/29/2017   ALBUMIN 2.5 (L) 10/13/2017    No results found for: "MG" No results found for: "VD25OH"  No results found for: "PREALBUMIN"    Latest Ref Rng & Units 04/11/2018    2:33 AM 12/29/2017    7:43 PM 10/13/2017    5:29 AM  CBC EXTENDED  WBC 4.0 - 10.5 K/uL 12.0  8.6  18.0   RBC 3.87 - 5.11 MIL/uL 4.45  4.62  3.50   Hemoglobin 12.0 - 15.0 g/dL 13.3  13.6  10.6   HCT 36.0 - 46.0 % 39.1  39.9  30.3   Platelets 150 - 400 K/uL 286  279  228   NEUT# 1.7 - 7.7 K/uL 8.7  5.9    Lymph# 0.7 - 4.0 K/uL 2.1  1.9       There is no height or weight on file to calculate BMI.  Orders:  No orders of the defined types were placed in this encounter.  Meds ordered this encounter  Medications   amoxicillin-clavulanate (AUGMENTIN) 500-125 MG tablet    Sig: Take  1 tablet by mouth 3 (three) times daily.    Dispense:  21 tablet    Refill:  0     Procedures: No procedures performed  Clinical Data: No additional findings.  ROS:  All other systems negative, except as noted in the HPI. Review of Systems  Objective: Vital Signs: There were no vitals taken for this visit.  Specialty Comments:  No specialty comments available.  PMFS History: Patient Active Problem List   Diagnosis Date Noted   Claw toe, acquired, left    Achilles tendon contracture, left    Chronic hypertension complicating or reason for care during pregnancy, third trimester 10/11/2017   History of recurrent miscarriages--x 4 05/01/2017   Diabetes mellitus without complication ([place]) 78/46/9629   AMA (advanced maternal age) multigravida 35+ 05/01/2017   Tachycardia 05/01/2017   Obesity (BMI 30-39.9) 02/05/2016   Benign essential HTN 01/25/2015   PVCs (premature ventricular contractions) 01/25/2015    Heart palpitations 11/21/2014   Thyroiditis    Past Medical History:  Diagnosis Date   Benign essential HTN 01/25/2015   Diabetes mellitus without complication ([place])    insuiln currently   Dysrhythmia    PVCs last EKG NSR 09/2016   Hypertension    Hypothyroidism    Infertility, female    Obesity (BMI 30-39.9) 02/05/2016   PCOS (polycystic ovarian syndrome)    PVCs (premature ventricular contractions) 01/25/2015   Thyroiditis    Hashimoto's now with hypothyroidism    Family History  Problem Relation Age of Onset   Heart disease Paternal Grandfather    Lymphoma Paternal Grandmother    Heart disease Maternal Grandmother    Heart disease Maternal Grandfather    Heart disease Father    Diabetes Mother    Hypertension Mother    Heart disease Mother        heart murmu   Hyperlipidemia Mother    Breast cancer Maternal Aunt    Cervical cancer Maternal Aunt     Past Surgical History:  Procedure Laterality Date   ACHILLES TENDON LENGTHENING Left 03/22/2019   Procedure: LEFT ACHILLES TENDON Z-LENGTHENING;  Surgeon: Newt Minion, MD;  Location: [facility];  Service: Orthopedics;  Laterality: Left;   AMPUTATION Left 11/10/2018   Procedure: AMPUTATION TOE 4TH;  Surgeon: Tyson Babinski, DPM;  [facility];  Service: Podiatry;  Laterality: Left;   CESAREAN SECTION WITH BILATERAL TUBAL LIGATION N/A 10/12/2017   Procedure: CESAREAN SECTION WITH BILATERAL TUBAL LIGATION;  Surgeon: Delsa Bern, MD;  Location: [facility];  Service: Obstetrics;  Laterality: N/A;   COLONOSCOPY WITH PROPOFOL N/A 07/09/2021   Procedure: COLONOSCOPY WITH PROPOFOL;  Surgeon: Eloise Harman, DO;  [facility];  Service: Endoscopy;  Laterality: N/A;  9:30 / ASA II   POLYPECTOMY  07/09/2021   Procedure: POLYPECTOMY;  Surgeon: Eloise Harman, DO;  [facility];  Service: Endoscopy;;   TONSILLECTOMY AND ADENOIDECTOMY     TUBAL LIGATION     WEIL OSTEOTOMY Left  11/06/2020   Procedure: LEFT FOOT 2ND AND 3RD METATARSAL WEIL OSTEOTOMY, PROXIMAL INTERPHALANGEAL RESECTION 2ND AND 3RD TOES;  Surgeon: Newt Minion, MD;  Location: [facility];  Service: Orthopedics;  Laterality: Left;   WISDOM TOOTH EXTRACTION     Social History   Occupational History   Not on file  Tobacco Use   Smoking status: Never   Smokeless tobacco: Never  Vaping Use   Vaping Use: Never used  Substance and Sexual Activity  Alcohol use: No    Alcohol/week: 0.0 standard drinks of alcohol   Drug use: No   Sexual activity: Yes    Birth control/protection: Surgical

## 2022-08-23 ENCOUNTER — Encounter (HOSPITAL_COMMUNITY): Payer: Self-pay

## 2022-08-23 ENCOUNTER — Other Ambulatory Visit: Payer: Self-pay | Admitting: Cardiology

## 2022-08-23 ENCOUNTER — Other Ambulatory Visit: Payer: Self-pay

## 2022-08-23 ENCOUNTER — Emergency Department (HOSPITAL_COMMUNITY)
Admission: EM | Admit: 2022-08-23 | Discharge: 2022-08-23 | Disposition: A | Discharge status: Home / Self Care | Payer: BC Managed Care – PPO | Attending: Emergency Medicine | Admitting: Emergency Medicine

## 2022-08-23 ENCOUNTER — Emergency Department (HOSPITAL_COMMUNITY): Payer: BC Managed Care – PPO

## 2022-08-23 DIAGNOSIS — E1165 Type 2 diabetes mellitus with hyperglycemia: Secondary | ICD-10-CM | POA: Insufficient documentation

## 2022-08-23 DIAGNOSIS — Z7984 Long term (current) use of oral hypoglycemic drugs: Secondary | ICD-10-CM | POA: Diagnosis not present

## 2022-08-23 DIAGNOSIS — I10 Essential (primary) hypertension: Secondary | ICD-10-CM | POA: Diagnosis not present

## 2022-08-23 DIAGNOSIS — R002 Palpitations: Secondary | ICD-10-CM | POA: Diagnosis present

## 2022-08-23 DIAGNOSIS — I491 Atrial premature depolarization: Secondary | ICD-10-CM | POA: Insufficient documentation

## 2022-08-23 DIAGNOSIS — E039 Hypothyroidism, unspecified: Secondary | ICD-10-CM | POA: Insufficient documentation

## 2022-08-23 DIAGNOSIS — R001 Bradycardia, unspecified: Secondary | ICD-10-CM

## 2022-08-23 LAB — BASIC METABOLIC PANEL
Anion gap: 11 (ref 5–15)
BUN: 13 mg/dL (ref 6–20)
CO2: 21 mmol/L — ABNORMAL LOW (ref 22–32)
Calcium: 8.9 mg/dL (ref 8.9–10.3)
Chloride: 105 mmol/L (ref 98–111)
Creatinine, Ser: 0.72 mg/dL (ref 0.44–1.00)
GFR, Estimated: >60 mL/min (ref >60)
Glucose, Bld: 201 mg/dL — ABNORMAL HIGH (ref 70–99)
Potassium: 4.2 mmol/L (ref 3.5–5.1)
Sodium: 137 mmol/L (ref 135–145)

## 2022-08-23 LAB — CBC
HCT: 43.2 % (ref 36.0–46.0)
Hemoglobin: 14.6 g/dL (ref 12.0–15.0)
MCH: 29.7 pg (ref 26.0–34.0)
MCHC: 33.8 g/dL (ref 30.0–36.0)
MCV: 87.8 fL (ref 80.0–100.0)
Platelets: 351 10*3/uL (ref 150–400)
RBC: 4.92 MIL/uL (ref 3.87–5.11)
RDW: 14 % (ref 11.5–15.5)
WBC: 10.7 10*3/uL — ABNORMAL HIGH (ref 4.0–10.5)
nRBC: 0 % (ref 0.0–0.2)

## 2022-08-23 LAB — TROPONIN I (HIGH SENSITIVITY)
Troponin I (High Sensitivity): 2 ng/L (ref <18)
Troponin I (High Sensitivity): <2 ng/L (ref <18)

## 2022-08-23 LAB — POC URINE PREG, ED: Preg Test, Ur: NEGATIVE

## 2022-08-23 LAB — MAGNESIUM: Magnesium: 2.2 mg/dL (ref 1.7–2.4)

## 2022-08-23 LAB — TSH: TSH: 2.616 u[IU]/mL (ref 0.350–4.500)

## 2022-08-23 NOTE — ED Triage Notes (Signed)
"  Got up at 5am and took routine medications and then began having palpitations and pressure in her chest" per EMS

## 2022-08-23 NOTE — ED Provider Notes (Signed)
[facility] EMERGENCY DEPARTMENT Provider Note   CSN: [id number] Arrival date & time: 08/23/22  0383     History  Chief Complaint  Patient presents with   Palpitations    Jordan Reeves is a 51 y.o. female.  51 year old female with a history of diabetes, hypertension, Hashimoto's thyroiditis now with hypothyroidism, and PVCs who presents the emergency department with palpitations, chest discomfort, and presyncope.  Patient reports that for the past 3 weeks she has been experiencing palpitations in the morning.  Says that they will last for approximately an hour and then resolved.  This typically coincides with when she takes her thyroid and hypertension medications.  Says that she is also had left-sided chest discomfort that radiates to her left arm.  Does not appear to be exertional does not have any diaphoresis or vomiting with the pain.  Last occurred last night but not today. No history of MI but father did have massive heart attack before the age of 53.  Patient called 911 today because when she was having her palpitation she checked her heart rate which was approximately 40 bpm.  EMS obtained EKG and route that showed PVCs versus type I Mobitz.  Patient denies any shortness of breath, cough, leg swelling, history of DVT or PE.  No fevers, runny nose, or sore throat.        Home Medications Prior to Admission medications   Medication Sig Start Date End Date Taking? Authorizing Provider  amoxicillin-clavulanate (AUGMENTIN) 500-125 MG tablet Take 1 tablet by mouth 3 (three) times daily. 08/13/22   Suzan Slick, NP  furosemide (LASIX) 20 MG tablet Take 20 mg by mouth daily as needed for fluid. 04/22/21   [provider]  labetalol (NORMODYNE) 200 MG tablet Take 2 tablets (400 mg total) by mouth 3 (three) times daily. Patient taking differently: Take 300 mg by mouth 3 (three) times daily. 04/29/17   Dunn, Nedra Hai, PA-C  levothyroxine (SYNTHROID) 88 MCG tablet Take 88 mcg  by mouth daily before breakfast.    [provider]  metFORMIN (GLUCOPHAGE-XR) 500 MG 24 hr tablet Take 1,000 mg by mouth at bedtime. 05/24/21   [provider]  Multiple Vitamin (MULTIVITAMIN ADULT PO) Take 1 tablet by mouth daily.    [provider]  mupirocin ointment (BACTROBAN) 2 % Apply 1 Application topically daily. 08/13/22   Suzan Slick, NP  NIFEdipine (PROCARDIA-XL/NIFEDICAL-XL) 30 MG 24 hr tablet Take 30 mg by mouth at bedtime. 03/14/21   [provider]  sulfamethoxazole-trimethoprim (BACTRIM DS) 800-160 MG tablet Take 1 tablet by mouth 2 (two) times daily. 06/24/22   Suzan Slick, NP      Allergies    Patient has no known allergies.    Review of Systems   Review of Systems  Physical Exam Updated Vital Signs BP 122/76   Pulse 85   Temp 98 F (36.7 C) (Oral)   Resp 16   Ht '5\' 8"'$  (1.727 m)   Wt 108.9 kg   LMP 08/07/2022   SpO2 98%   BMI 36.49 kg/m  Physical Exam Vitals and nursing note reviewed.  Constitutional:      General: She is not in acute distress.    Appearance: She is well-developed.  HENT:     Head: Normocephalic and atraumatic.     Right Ear: External ear normal.     Left Ear: External ear normal.     Nose: Nose normal.  Eyes:     Extraocular  Movements: Extraocular movements intact.     Conjunctiva/sclera: Conjunctivae normal.     Pupils: Pupils are equal, round, and reactive to light.  Cardiovascular:     Rate and Rhythm: Normal rate and regular rhythm.     Heart sounds: No murmur heard. Pulmonary:     Effort: Pulmonary effort is normal. No respiratory distress.     Breath sounds: Normal breath sounds.  Abdominal:     General: Abdomen is flat. There is no distension.     Palpations: Abdomen is soft. There is no mass.     Tenderness: There is no abdominal tenderness. There is no guarding.  Musculoskeletal:     Cervical back: Normal range of motion and neck supple.     Right lower leg: No edema.     Left  lower leg: No edema.  Skin:    General: Skin is warm and dry.  Neurological:     Mental Status: She is alert and oriented to person, place, and time. Mental status is at baseline.  Psychiatric:        Mood and Affect: Mood normal.     ED Results / Procedures / Treatments   Labs (all labs ordered are listed, but only abnormal results are displayed) Labs Reviewed  BASIC METABOLIC PANEL - Abnormal; Notable for the following components:      Result Value   CO2 21 (*)    Glucose, Bld 201 (*)    All other components within normal limits  CBC - Abnormal; Notable for the following components:   WBC 10.7 (*)    All other components within normal limits  MAGNESIUM  TSH  POC URINE PREG, ED  TROPONIN I (HIGH SENSITIVITY)  TROPONIN I (HIGH SENSITIVITY)    EKG EKG Interpretation  Date/Time:  Saturday August 23 2022 08:27:16 EST Ventricular Rate:  81 PR Interval:  147 QRS Duration: 90 QT Interval:  373 QTC Calculation: 433 R Axis:   62 Text Interpretation: Sinus rhythm Low voltage, extremity and precordial leads Confirmed by Margaretmary Eddy [phone]) on 08/23/2022 8:51:12 AM  EMS strips       Radiology DG Chest 2 View  Result Date: 08/23/2022 CLINICAL DATA:  Palpitations. EXAM: CHEST - 2 VIEW COMPARISON:  12/29/2017 FINDINGS: The cardiomediastinal silhouette is unremarkable. Mild RIGHT hemidiaphragm elevation again noted. There is no evidence of focal airspace disease, pulmonary edema, suspicious pulmonary nodule/mass, pleural effusion, or pneumothorax. No acute bony abnormalities are identified. IMPRESSION: No active cardiopulmonary disease. Electronically Signed   By: Margarette Canada M.D.   On: 08/23/2022 09:28    Procedures Procedures   Medications Ordered in ED Medications - No data to display  ED Course/ Medical Decision Making/ A&P Clinical Course as of 08/23/22 2018  Sat Aug 23, 2022  1003 Dr Croitoru from cardiology has called feels that pt was having pacs. Feels  that she should fu with cardiology as an outpatient. They will arrange for a Holter monitor.  [RP]    Clinical Course User Index [RP] Fransico Meadow, MD                           Medical Decision Making Amount and/or Complexity of Data Reviewed Labs: ordered. Radiology: ordered.   Jordan Reeves is a 51 y.o. female with comorbidities that complicate the patient evaluation including diabetes, hypertension, Hashimoto's thyroiditis now with hypothyroidism, and PVCs who presents the emergency department with palpitations, chest discomfort, and presyncope.   Initial  Ddx:  Arrhythmia, MI, PE  MDM:  Feel the patient may be having paroxysmal arrhythmia.  Based on EKGs from EMS appears that she is having premature atrial contractions rather than heart block.  Feel this is likely the cause of her symptoms.  Considered MI given her chest discomfort to obtain EKGs and troponins but feel this is less likely.  Plan:  Labs TSH Troponin EKG Chest x-ray  ED Summary/Re-evaluation:  Patient underwent the above workup that was reassuring.  EKG did not show signs that would be concerning for malignant arrhythmia.  Was discussed with cardiology who will send the patient out with outpatient Holter monitor.  Ambulatory referral to cardiology placed.  Also instructed patient to follow-up with her primary doctor in several days.  This patient presents to the ED for concern of complaints listed in HPI, this involves an extensive number of treatment options, and is a complaint that carries with it a high risk of complications and morbidity. Disposition including potential need for admission considered.   Dispo: DC Home. Return precautions discussed including, but not limited to, those listed in the AVS. Allowed pt time to ask questions which were answered fully prior to dc.  Additional history obtained from spouse Records reviewed Outpatient Clinic Notes The following labs were independently  interpreted: Chemistry and Serial Troponins and show  hyperglycemia I independently reviewed the following imaging with scope of interpretation limited to determining acute life threatening conditions related to emergency care: Chest x-ray and agree with the radiologist interpretation with the following exceptions: None I personally reviewed and interpreted cardiac monitoring: normal sinus rhythm  I personally reviewed and interpreted the pt's EKG: see above for interpretation  I have reviewed the patients home medications and made adjustments as needed Consults: Cardiology  Final Clinical Impression(s) / ED Diagnoses Final diagnoses:  Palpitations  PAC (premature atrial contraction)    Rx / DC Orders ED Discharge Orders          Ordered    Ambulatory referral to Cardiology       Comments: Palpitations.  Holter monitor   08/23/22 1049              Fransico Meadow, MD 08/23/22 2018

## 2022-08-23 NOTE — ED Notes (Signed)
Ambulatory to bathroom to void.. No signs of distress. Gait steady. No complaints.

## 2022-08-23 NOTE — Discharge Instructions (Signed)
You were seen for your palpitations and chest discomfort in the emergency department.   At home, please avoid heavy alcohol and caffeine use.    Follow-up with your primary doctor in 2-3 days regarding your visit.  Your thyroid function was normal today.  Cardiology will contact you about a Holter monitor.  If you do not hear from them within 72 hours please call Dr. Theodosia Blender office to set up an appointment.  Return immediately to the emergency department if you experience any of the following: Severe chest pain, difficulty breathing, fainting, or any other concerning symptoms.    Thank you for visiting our Emergency Department. It was a pleasure taking care of you today.

## 2022-08-26 ENCOUNTER — Ambulatory Visit: Payer: BC Managed Care – PPO | Attending: Cardiology

## 2022-08-26 DIAGNOSIS — R001 Bradycardia, unspecified: Secondary | ICD-10-CM

## 2022-08-26 NOTE — Progress Notes (Unsigned)
Enrolled for [employer] to mail a ZIO XT long term holter monitor to the patients address on file.  

## 2022-08-28 DIAGNOSIS — R001 Bradycardia, unspecified: Secondary | ICD-10-CM

## 2022-09-02 NOTE — Progress Notes (Unsigned)
Office Visit    Patient Name: Jordan Reeves Date of Encounter: 09/02/2022  Primary Care Provider:  Caryl Bis, MD Primary Cardiologist:  None Primary Electrophysiologist: None  Chief Complaint    Jordan Reeves is a 52 y.o. female with PMH of HTN, IDDM type II, PVCs, PCOS, hypothyroidism, obesity, Hashimoto's thyroiditis who presents today for follow-up of palpitations.  Past Medical History    Past Medical History:  Diagnosis Date   Benign essential HTN 01/25/2015   Diabetes mellitus without complication ([place])    insuiln currently   Dysrhythmia    PVCs last EKG NSR 09/2016   Hypertension    Hypothyroidism    Infertility, female    Obesity (BMI 30-39.9) 02/05/2016   PCOS (polycystic ovarian syndrome)    PVCs (premature ventricular contractions) 01/25/2015   Thyroiditis    Hashimoto's now with hypothyroidism   Past Surgical History:  Procedure Laterality Date   ACHILLES TENDON LENGTHENING Left 03/22/2019   Procedure: LEFT ACHILLES TENDON Z-LENGTHENING;  Surgeon: Jordan Minion, MD;  Location: [place];  Service: Orthopedics;  Laterality: Left;   AMPUTATION Left 11/10/2018   Procedure: AMPUTATION TOE 4TH;  Surgeon: Jordan Reeves, DPM;  Location: [facility];  Service: Podiatry;  Laterality: Left;   CESAREAN SECTION WITH BILATERAL TUBAL LIGATION N/A 10/12/2017   Procedure: CESAREAN SECTION WITH BILATERAL TUBAL LIGATION;  Surgeon: Jordan Bern, MD;  Location: [place];  Service: Obstetrics;  Laterality: N/A;   COLONOSCOPY WITH PROPOFOL N/A 07/09/2021   Procedure: COLONOSCOPY WITH PROPOFOL;  Surgeon: Jordan Harman, DO;  [facility];  Service: Endoscopy;  Laterality: N/A;  9:30 / ASA II   POLYPECTOMY  07/09/2021   Procedure: POLYPECTOMY;  Surgeon: Jordan Harman, DO;  [facility];  Service: Endoscopy;;   TONSILLECTOMY AND ADENOIDECTOMY     TUBAL LIGATION     WEIL OSTEOTOMY Left 11/06/2020   Procedure: LEFT  FOOT 2ND AND 3RD METATARSAL WEIL OSTEOTOMY, PROXIMAL INTERPHALANGEAL RESECTION 2ND AND 3RD TOES;  Surgeon: Jordan Minion, MD;  Location: [place];  Service: Orthopedics;  Laterality: Left;   WISDOM TOOTH EXTRACTION      Allergies  No Known Allergies  History of Present Illness    Jordan Reeves  is a 52 year old female with the above mention past medical history who presents today for follow-up of recent ED visit for palpitations.  Jordan Reeves recently was seen by Jordan Reeves in 2016 for elevated blood pressure and palpitations.  She wore an event monitor that showed sinus rhythm with occasional sinus tach and rare PVCs.  2D echo was completed 01/2015 that showed EF of 60-65% with grade 1 DD and mild LVH.  She underwent nuclear stress test for chest pain that was normal.  She presented to the ED via EMS on 08/23/2022 for complaint of palpitations and bradycardia.  EKG was completed and route that showed PVCs versus second-degree type I Mobitz.  She was consulted by Jordan Reeves who felt that EKG showed PACs rather than second-degree heart block.  ACS workup was reassuring and negative. She was given 3-day ZIO Holter monitor for evaluation of PACs that is still in progress.   Jordan Reeves presents today for post ED follow-up with her husband and son.  Since last being seen in the office patient reports that she is been feeling fine with no recurrence of presyncope or syncope since her ED visit.  Her blood pressure today however  is hypertensive at 168/90 and she reports that she has been self adjusting her labetalol medication.  She is also discontinued Armour Thyroid and has reinitiated Synthroid on her own.  During our visit we discussed med compliance and advising her healthcare team of any changes she makes to her medications.  We reviewed the pathophysiology of heart block and discussed possible differential diagnosis is related to her syncopal episode.  She reports that when her blood  pressure is elevated she is sometimes symptomatic with chest heaviness.  She reports that this occurs off and on and spontaneously resolves.  Patient denies chest pain, palpitations, dyspnea, PND, orthopnea, nausea, vomiting, dizziness, syncope, edema, weight gain, or early satiety.  Home Medications    Current Outpatient Medications  Medication Sig Dispense Refill   amoxicillin-clavulanate (AUGMENTIN) 500-125 MG tablet Take 1 tablet by mouth 3 (three) times daily. 21 tablet 0   furosemide (LASIX) 20 MG tablet Take 20 mg by mouth daily as needed for fluid.     labetalol (NORMODYNE) 200 MG tablet Take 2 tablets (400 mg total) by mouth 3 (three) times daily. (Patient taking differently: Take 300 mg by mouth 3 (three) times daily.)     levothyroxine (SYNTHROID) 88 MCG tablet Take 88 mcg by mouth daily before breakfast.     metFORMIN (GLUCOPHAGE-XR) 500 MG 24 hr tablet Take 1,000 mg by mouth at bedtime.     Multiple Vitamin (MULTIVITAMIN ADULT PO) Take 1 tablet by mouth daily.     mupirocin ointment (BACTROBAN) 2 % Apply 1 Application topically daily. 22 g 0   NIFEdipine (PROCARDIA-XL/NIFEDICAL-XL) 30 MG 24 hr tablet Take 30 mg by mouth at bedtime.     sulfamethoxazole-trimethoprim (BACTRIM DS) 800-160 MG tablet Take 1 tablet by mouth 2 (two) times daily. 20 tablet 0   No current facility-administered medications for this visit.     Review of Systems  Please see the history of present illness.    (+) Chest heaviness (+) Low  All other systems reviewed and are otherwise negative except as noted above.  Physical Exam    Wt Readings from Last 3 Encounters:  08/23/22 240 lb (108.9 kg)  07/09/21 248 lb (112.5 kg)  06/10/21 253 lb 12.8 oz (115.1 kg)   AO:ZHYQM were no vitals filed for this visit.,There is no height or weight on file to calculate BMI.  Constitutional:      Appearance: Healthy appearance. Not in distress.  Neck:     Vascular: JVD normal.  Pulmonary:     Effort: Pulmonary  effort is normal.     Breath sounds: No wheezing. No rales. Diminished in the bases Cardiovascular:     Normal rate. Regular rhythm. Normal S1. Normal S2.      Murmurs: There is no murmur.  Edema:    Peripheral edema absent.  Abdominal:     Palpations: Abdomen is soft non tender. There is no hepatomegaly.  Skin:    General: Skin is warm and dry.  Neurological:     General: No focal deficit present.     Mental Status: Alert and oriented to person, place and time.     Cranial Nerves: Cranial nerves are intact.  EKG/LABS/Other Studies Reviewed    ECG personally reviewed by me today -none completed today   Lab Results  Component Value Date   WBC 10.7 (H) 08/23/2022   HGB 14.6 08/23/2022   HCT 43.2 08/23/2022   MCV 87.8 08/23/2022   PLT 351 08/23/2022   Lab Results  Component Value Date   CREATININE 0.72 08/23/2022   BUN 13 08/23/2022   NA 137 08/23/2022   K 4.2 08/23/2022   CL 105 08/23/2022   CO2 21 (L) 08/23/2022   Lab Results  Component Value Date   ALT 14 04/11/2018   AST 14 (L) 04/11/2018   ALKPHOS 58 04/11/2018   BILITOT 0.5 04/11/2018   No results found for: "CHOL", "HDL", "LDLCALC", "LDLDIRECT", "TRIG", "CHOLHDL"  Lab Results  Component Value Date   HGBA1C 6.7 (H) 11/08/2018    Assessment & Plan    1.  Palpitations: -Patient presented 08/23/2022 with complaint of palpitations the EMS.  EKG was completed showing possible PACs versus heart block. 3-day ZIO monitor was completed that is pending completion. -Today patient reports that her palpitations have resolved and has not felt any syncope or presyncope since ED visit. -Continue labetalol 200 mg 3 times daily and nifedipine 30 mg once daily  2.  Essential hypertension: -Patient's blood pressure today was elevated at 168/90.  Patient declined recheck at this time. -Continue labetalol and nifedipine as noted above  3.  Precordial chest pain: -Patient reports heavy chest discomfort and sharp abdominal  pain between shoulder blades notably when blood pressure is elevated. -We will complete cardiac CTA to evaluate for possible ischemia related to chest discomfort.  4.  Obesity: -Patient's BMI is 36.49 kg/m   5. History of Hashimoto's thyroiditis: -Patient self discontinued Armour Thyroid and initiated Synthroid 88 mcg -She was advised to discuss further titration with her PCP  Disposition: Follow-up with None or APP in 2 months  Medication Adjustments/Labs and Tests Ordered: Current medicines are reviewed at length with the patient today.  Concerns regarding medicines are outlined above.   Signed, Mable Fill, Marissa Nestle, NP 09/02/2022, 9:28 PM [facility]  Note:  This document was prepared using Dragon voice recognition software and may include unintentional dictation errors.

## 2022-09-03 ENCOUNTER — Encounter: Payer: Self-pay | Admitting: Nurse Practitioner

## 2022-09-03 ENCOUNTER — Ambulatory Visit: Payer: BC Managed Care – PPO | Attending: Nurse Practitioner | Admitting: Nurse Practitioner

## 2022-09-03 VITALS — BP 168/90 | HR 90 | Ht 68.0 in | Wt 253.2 lb

## 2022-09-03 DIAGNOSIS — E063 Autoimmune thyroiditis: Secondary | ICD-10-CM

## 2022-09-03 DIAGNOSIS — R002 Palpitations: Secondary | ICD-10-CM

## 2022-09-03 DIAGNOSIS — R072 Precordial pain: Secondary | ICD-10-CM

## 2022-09-03 DIAGNOSIS — I10 Essential (primary) hypertension: Secondary | ICD-10-CM

## 2022-09-03 DIAGNOSIS — E669 Obesity, unspecified: Secondary | ICD-10-CM | POA: Diagnosis not present

## 2022-09-03 NOTE — Patient Instructions (Addendum)
Medication Instructions:  Your physician recommends that you continue on your current medications as directed. Please refer to the Current Medication list given to you today. *If you need a refill on your cardiac medications before your next appointment, please call your pharmacy*   Lab Work: None Ordered   Testing/Procedures: Coronary CT   Follow-Up: At [facility], you and your health needs are our priority.  As part of our continuing mission to provide you with exceptional heart care, we have created designated Provider Care Teams.  These Care Teams include your primary Cardiologist (physician) and Advanced Practice Providers (APPs -  Physician Assistants and Nurse Practitioners) who all work together to provide you with the care you need, when you need it.  We recommend signing up for the patient portal called "MyChart".  Sign up information is provided on this After Visit Summary.  MyChart is used to connect with patients for Virtual Visits (Telemedicine).  Patients are able to view lab/test results, encounter notes, upcoming appointments, etc.  Non-urgent messages can be sent to your provider as well.   To learn more about what you can do with MyChart, go to [email or web address].    Your next appointment:   2 month(s)  The format for your next appointment:   In Person  Provider:   Fransico Him, MD   Other Instructions INSTRUCTIONS FOR CORONARY CTA Please arrive at the [facility] main entrance of [facility] at (30-45 minutes prior to test start time)  [facility] [address], [place] [address] [phone]  Proceed to the [facility] Radiology Department (First Floor).  Please follow these instructions carefully (unless otherwise directed): PLEASE HAVE LABS - BMP  AT LEAST ONE WEEK PRIOR TO TEST  On the Night Before the Test: Drink plenty of water. Do not consume any caffeinated/decaffeinated beverages or chocolate 12  hours prior to your test. Do not take any antihistamines 12 hours prior to your test.  On the Day of the Test: Drink plenty of water. Do not drink any water within one hour of the test. Do not eat any food 4 hours prior to the test. You may take your regular medications prior to the test. Take an extra half tablet of Labetalol one hour before the test.  After the Test: Drink plenty of water. After receiving IV contrast, you may experience a mild flushed feeling. This is normal. On occasion, you may experience a mild rash up to 24 hours after the test. This is not dangerous. If this occurs, you can take Benadryl 25 mg and increase your fluid intake. If you experience trouble breathing, this can be serious. If it is severe call 911 IMMEDIATELY. If it is mild, please call our office.   Important Information About Sugar

## 2022-09-04 ENCOUNTER — Telehealth: Payer: Self-pay

## 2022-09-04 DIAGNOSIS — E069 Thyroiditis, unspecified: Secondary | ICD-10-CM

## 2022-09-04 DIAGNOSIS — I493 Ventricular premature depolarization: Secondary | ICD-10-CM

## 2022-09-04 NOTE — Addendum Note (Signed)
Addended by: Joni Reining on: 09/04/2022 05:27 PM   Modules accepted: Orders

## 2022-09-04 NOTE — Telephone Encounter (Signed)
Spoke to patient who agrees to [facility] lab and Echocardiogram. Orders placed, labs and Echo scheduled. Discussed Echo prep with patient who verbalizes understanding.

## 2022-09-04 NOTE — Telephone Encounter (Signed)
-----   Message from Sueanne Margarita, MD sent at 09/04/2022 11:43 AM EST ----- Heart monitor showed a rare extra heartbeat from the top and bottom of the heart which are benign.  Labs last month showed normal potassium and magnesium.  Please have her come in for a TSH and a 2D echo.  If these are normal would not recommend any further therapy given infrequency of extra beats.  Will continue labetalol as currently taking

## 2022-09-05 ENCOUNTER — Ambulatory Visit (INDEPENDENT_AMBULATORY_CARE_PROVIDER_SITE_OTHER): Payer: BC Managed Care – PPO

## 2022-09-05 ENCOUNTER — Encounter: Payer: Self-pay | Admitting: Family

## 2022-09-05 ENCOUNTER — Ambulatory Visit (INDEPENDENT_AMBULATORY_CARE_PROVIDER_SITE_OTHER): Payer: BC Managed Care – PPO | Admitting: Family

## 2022-09-05 DIAGNOSIS — L02612 Cutaneous abscess of left foot: Secondary | ICD-10-CM

## 2022-09-05 NOTE — Progress Notes (Signed)
Office Visit Note   Patient: Jordan Reeves           Date of Birth: [birth year]           MRN: [id number] Visit Date: 09/05/2022              Requested by: Caryl Bis, MD [place],  [place] [address] PCP: Caryl Bis, MD  Chief Complaint  Patient presents with   Left Foot - Wound Check      HPI: The patient is a 52 year old woman who presents in follow-up for abscess to the fifth toe of the left foot.  This has improved greatly continues to be mildly swollen but the redness and significant swelling have improved.  She has completed her course of antibiotics.  She wonders if she has osteomyelitis is concerned she may eventually need this toe amputated  At this time not having any drainage fever or chills  Assessment & Plan: Visit Diagnoses:  1. Abscess of fifth toe, left     Plan: Discussed concern for osteomyelitis I share this concern.  At this time is the toe has improved and the wound is healed we can follow this conservatively.  She understands she is to return for any worsening  Follow-Up Instructions: No follow-ups on file.   Ortho Exam  Patient is alert, oriented, no adenopathy, well-dressed, normal affect, normal respiratory effort. On examination of the left foot she does have some mild callused ulceration of the lateral border of the fifth toe the ulcer is pinpoint there is no drainage granulation no sign of infection.  No sausage digit swelling of the fifth toe.  Has a palpable dorsalis pedis pulse on the left.  Imaging: LONG TERM MONITOR (3-14 DAYS)  Result Date: 09/04/2022   Predominant rhythm was normal sinus rhythm with average heart rate 73 bpm and ranged from 54 to 117 bpm   Rare PAC and PVC corresponding to patient's symptoms of palpitations Patch Wear Time:  3 days and 0 hours (2024-01-04T18:08:20-0500 to 2024-01-07T18:52:46-0500) Patient had a min HR of 54 bpm, max HR of 117 bpm, and avg HR of 73 bpm. Predominant underlying rhythm was  Sinus Rhythm. Isolated SVEs were rare (<1.0%), and no SVE Couplets or SVE Triplets were present. Isolated VEs were rare (<1.0%), and no VE Couplets  or VE Triplets were present.   No images are attached to the encounter.  Labs: Lab Results  Component Value Date   HGBA1C 6.7 (H) 11/08/2018   ESRSEDRATE 20 04/11/2018   REPTSTATUS 11/15/2018 FINAL 11/10/2018   GRAMSTAIN  11/10/2018    FEW WBC PRESENT, PREDOMINANTLY MONONUCLEAR NO ORGANISMS SEEN    CULT  11/10/2018    RARE PSEUDOMONAS AERUGINOSA NO ANAEROBES ISOLATED Performed at [facility], [place] [address] [place], [place] [address]    LABORGA PSEUDOMONAS AERUGINOSA 11/10/2018     Lab Results  Component Value Date   ALBUMIN 4.2 04/11/2018   ALBUMIN 4.2 12/29/2017   ALBUMIN 2.5 (L) 10/13/2017    Lab Results  Component Value Date   MG 2.2 08/23/2022   No results found for: "VD25OH"  No results found for: "PREALBUMIN"    Latest Ref Rng & Units 08/23/2022    8:48 AM 04/11/2018    2:33 AM 12/29/2017    7:43 PM  CBC EXTENDED  WBC 4.0 - 10.5 K/uL 10.7  12.0  8.6   RBC 3.87 - 5.11 MIL/uL 4.92  4.45  4.62   Hemoglobin  12.0 - 15.0 g/dL 14.6  13.3  13.6   HCT 36.0 - 46.0 % 43.2  39.1  39.9   Platelets 150 - 400 K/uL 351  286  279   NEUT# 1.7 - 7.7 K/uL  8.7  5.9   Lymph# 0.7 - 4.0 K/uL  2.1  1.9      There is no height or weight on file to calculate BMI.  Orders:  Orders Placed This Encounter  Procedures   XR Toe 5th Left   No orders of the defined types were placed in this encounter.    Procedures: No procedures performed  Clinical Data: No additional findings.  ROS:  All other systems negative, except as noted in the HPI. Review of Systems  Objective: Vital Signs: LMP 08/07/2022   Specialty Comments:  No specialty comments available.  PMFS History: Patient Active Problem List   Diagnosis Date Noted   Claw toe, acquired, left    Achilles tendon contracture, left    Chronic hypertension  complicating or reason for care during pregnancy, third trimester 10/11/2017   History of recurrent miscarriages--x 4 05/01/2017   Diabetes mellitus without complication ([place]) 99991111   AMA (advanced maternal age) multigravida 35+ 05/01/2017   Tachycardia 05/01/2017   Obesity (BMI 30-39.9) 02/05/2016   Benign essential HTN 01/25/2015   PVCs (premature ventricular contractions) 01/25/2015   Heart palpitations 11/21/2014   Thyroiditis    Past Medical History:  Diagnosis Date   Benign essential HTN 01/25/2015   Diabetes mellitus without complication ([place])    insuiln currently   Dysrhythmia    PVCs last EKG NSR 09/2016   Hypertension    Hypothyroidism    Infertility, female    Obesity (BMI 30-39.9) 02/05/2016   PCOS (polycystic ovarian syndrome)    PVCs (premature ventricular contractions) 01/25/2015   Thyroiditis    Hashimoto's now with hypothyroidism    Family History  Problem Relation Age of Onset   Heart disease Paternal Grandfather    Lymphoma Paternal Grandmother    Heart disease Maternal Grandmother    Heart disease Maternal Grandfather    Heart disease Father    Diabetes Mother    Hypertension Mother    Heart disease Mother        heart murmu   Hyperlipidemia Mother    Breast cancer Maternal Aunt    Cervical cancer Maternal Aunt     Past Surgical History:  Procedure Laterality Date   ACHILLES TENDON LENGTHENING Left 03/22/2019   Procedure: LEFT ACHILLES TENDON Z-LENGTHENING;  Surgeon: Newt Minion, MD;  Location: [facility];  Service: Orthopedics;  Laterality: Left;   AMPUTATION Left 11/10/2018   Procedure: AMPUTATION TOE 4TH;  Surgeon: Tyson Babinski, DPM;  [facility];  Service: Podiatry;  Laterality: Left;   CESAREAN SECTION WITH BILATERAL TUBAL LIGATION N/A 10/12/2017   Procedure: CESAREAN SECTION WITH BILATERAL TUBAL LIGATION;  Surgeon: Delsa Bern, MD;  Location: [facility];  Service: Obstetrics;  Laterality: N/A;    COLONOSCOPY WITH PROPOFOL N/A 07/09/2021   Procedure: COLONOSCOPY WITH PROPOFOL;  Surgeon: Eloise Harman, DO;  [facility];  Service: Endoscopy;  Laterality: N/A;  9:30 / ASA II   POLYPECTOMY  07/09/2021   Procedure: POLYPECTOMY;  Surgeon: Eloise Harman, DO;  [facility];  Service: Endoscopy;;   TONSILLECTOMY AND ADENOIDECTOMY     TUBAL LIGATION     WEIL OSTEOTOMY Left 11/06/2020   Procedure: LEFT FOOT 2ND AND 3RD METATARSAL WEIL OSTEOTOMY,  PROXIMAL INTERPHALANGEAL RESECTION 2ND AND 3RD TOES;  Surgeon: Newt Minion, MD;  Location: [facility];  Service: Orthopedics;  Laterality: Left;   WISDOM TOOTH EXTRACTION     Social History   Occupational History   Not on file  Tobacco Use   Smoking status: Never   Smokeless tobacco: Never  Vaping Use   Vaping Use: Never used  Substance and Sexual Activity   Alcohol use: No    Alcohol/week: 0.0 standard drinks of alcohol   Drug use: No   Sexual activity: Yes    Birth control/protection: Surgical

## 2022-09-08 ENCOUNTER — Ambulatory Visit: Payer: BC Managed Care – PPO | Attending: Cardiology

## 2022-09-08 DIAGNOSIS — E069 Thyroiditis, unspecified: Secondary | ICD-10-CM

## 2022-09-09 LAB — TSH: TSH: 2.37 u[IU]/mL (ref 0.450–4.500)

## 2022-09-11 ENCOUNTER — Telehealth: Payer: Self-pay

## 2022-09-11 NOTE — Telephone Encounter (Signed)
Patient called. She saw Junie Panning recently for her toe. She was told that it was need amputation in the future. She has finished her antibiotics. She states that her toe is very painful, and typically she cannot feel her feet. I have her scheduled to see Dr.Duda on Monday morning, but wanted to make you aware in case she needs something sooner or something called into [facility]. Call back # if needed [phone].

## 2022-09-15 ENCOUNTER — Ambulatory Visit: Payer: BC Managed Care – PPO | Admitting: Orthopedic Surgery

## 2022-09-15 ENCOUNTER — Encounter: Payer: Self-pay | Admitting: Orthopedic Surgery

## 2022-09-15 DIAGNOSIS — L02612 Cutaneous abscess of left foot: Secondary | ICD-10-CM

## 2022-09-15 NOTE — Progress Notes (Signed)
Office Visit Note   Patient: Jordan Reeves           Date of Birth: [birth year]           MRN: [id number] Visit Date: 09/15/2022              Requested by: Caryl Bis, MD [place],  [place] [address] PCP: Caryl Bis, MD  Chief Complaint  Patient presents with   Left Foot - Follow-up, Pain   Right Foot - Pain      HPI: Patient is a 52 year old woman who presents in follow-up for abscess draining ulcer cellulitis and sausage digit swelling of the left little toe.  Patient has also had some swelling of the right third toe.  Patient states she is just completed her antibiotics 2 weeks ago.  She states she has bad neuropathy from her diabetes.  Assessment & Plan: Visit Diagnoses:  1. Abscess of fifth toe, left     Plan: Discussed with patient the treatment options for the left little toe.  I have recommended amputation of the left little to through the MTP joint.  Patient states she would like to proceed with surgery after she has completed her cardiac workup and would like surgery beginning of March.  Follow-Up Instructions: Return in about 2 weeks (around 09/29/2022).   Ortho Exam  Patient is alert, oriented, no adenopathy, well-dressed, normal affect, normal respiratory effort. Examination patient has sausage digit swelling and mild redness of the left little toe there is no drainage at this time.  The callus was pared and there is an ulcer that goes down to bone.  Radiographs show destructive bony changes of the tuft of the left little toe.  Patient also has a callus beneath the first metatarsal head.  The callus was pared without complication.  After debridement the ulcer is 10 mm in diameter and 1 mm deep.  There is healthy granulation tissue that was touched with silver nitrate.  Patient has a palpable dorsalis pedis pulse.  There is no drainage or redness or swelling from the right third toe.  Imaging: No results found. No images are attached to the  encounter.  Labs: Lab Results  Component Value Date   HGBA1C 6.7 (H) 11/08/2018   ESRSEDRATE 20 04/11/2018   REPTSTATUS 11/15/2018 FINAL 11/10/2018   GRAMSTAIN  11/10/2018    FEW WBC PRESENT, PREDOMINANTLY MONONUCLEAR NO ORGANISMS SEEN    CULT  11/10/2018    RARE PSEUDOMONAS AERUGINOSA NO ANAEROBES ISOLATED Performed at [facility], [place] [address] [place],  [address]    LABORGA PSEUDOMONAS AERUGINOSA 11/10/2018     Lab Results  Component Value Date   ALBUMIN 4.2 04/11/2018   ALBUMIN 4.2 12/29/2017   ALBUMIN 2.5 (L) 10/13/2017    Lab Results  Component Value Date   MG 2.2 08/23/2022   No results found for: "VD25OH"  No results found for: "PREALBUMIN"    Latest Ref Rng & Units 08/23/2022    8:48 AM 04/11/2018    2:33 AM 12/29/2017    7:43 PM  CBC EXTENDED  WBC 4.0 - 10.5 K/uL 10.7  12.0  8.6   RBC 3.87 - 5.11 MIL/uL 4.92  4.45  4.62   Hemoglobin 12.0 - 15.0 g/dL 14.6  13.3  13.6   HCT 36.0 - 46.0 % 43.2  39.1  39.9   Platelets 150 - 400 K/uL 351  286  279   NEUT# 1.7 - 7.7  K/uL  8.7  5.9   Lymph# 0.7 - 4.0 K/uL  2.1  1.9      There is no height or weight on file to calculate BMI.  Orders:  No orders of the defined types were placed in this encounter.  No orders of the defined types were placed in this encounter.    Procedures: No procedures performed  Clinical Data: No additional findings.  ROS:  All other systems negative, except as noted in the HPI. Review of Systems  Objective: Vital Signs: LMP 08/07/2022   Specialty Comments:  No specialty comments available.  PMFS History: Patient Active Problem List   Diagnosis Date Noted   Claw toe, acquired, left    Achilles tendon contracture, left    Chronic hypertension complicating or reason for care during pregnancy, third trimester 10/11/2017   History of recurrent miscarriages--x 4 05/01/2017   Diabetes mellitus without complication ([place]) 08/27/7251   AMA (advanced maternal  age) multigravida 35+ 05/01/2017   Tachycardia 05/01/2017   Obesity (BMI 30-39.9) 02/05/2016   Benign essential HTN 01/25/2015   PVCs (premature ventricular contractions) 01/25/2015   Heart palpitations 11/21/2014   Thyroiditis    Past Medical History:  Diagnosis Date   Benign essential HTN 01/25/2015   Diabetes mellitus without complication ([place])    insuiln currently   Dysrhythmia    PVCs last EKG NSR 09/2016   Hypertension    Hypothyroidism    Infertility, female    Obesity (BMI 30-39.9) 02/05/2016   PCOS (polycystic ovarian syndrome)    PVCs (premature ventricular contractions) 01/25/2015   Thyroiditis    Hashimoto's now with hypothyroidism    Family History  Problem Relation Age of Onset   Heart disease Paternal Grandfather    Lymphoma Paternal Grandmother    Heart disease Maternal Grandmother    Heart disease Maternal Grandfather    Heart disease Father    Diabetes Mother    Hypertension Mother    Heart disease Mother        heart murmu   Hyperlipidemia Mother    Breast cancer Maternal Aunt    Cervical cancer Maternal Aunt     Past Surgical History:  Procedure Laterality Date   ACHILLES TENDON LENGTHENING Left 03/22/2019   Procedure: LEFT ACHILLES TENDON Z-LENGTHENING;  Surgeon: Newt Minion, MD;  Location: [facility];  Service: Orthopedics;  Laterality: Left;   AMPUTATION Left 11/10/2018   Procedure: AMPUTATION TOE 4TH;  Surgeon: Tyson Babinski, DPM;  [facility];  Service: Podiatry;  Laterality: Left;   CESAREAN SECTION WITH BILATERAL TUBAL LIGATION N/A 10/12/2017   Procedure: CESAREAN SECTION WITH BILATERAL TUBAL LIGATION;  Surgeon: Delsa Bern, MD;  Location: [facility];  Service: Obstetrics;  Laterality: N/A;   COLONOSCOPY WITH PROPOFOL N/A 07/09/2021   Procedure: COLONOSCOPY WITH PROPOFOL;  Surgeon: Eloise Harman, DO;  [facility];  Service: Endoscopy;  Laterality: N/A;  9:30 / ASA II   POLYPECTOMY  07/09/2021    Procedure: POLYPECTOMY;  Surgeon: Eloise Harman, DO;  [facility];  Service: Endoscopy;;   TONSILLECTOMY AND ADENOIDECTOMY     TUBAL LIGATION     WEIL OSTEOTOMY Left 11/06/2020   Procedure: LEFT FOOT 2ND AND 3RD METATARSAL WEIL OSTEOTOMY, PROXIMAL INTERPHALANGEAL RESECTION 2ND AND 3RD TOES;  Surgeon: Newt Minion, MD;  Location: [facility];  Service: Orthopedics;  Laterality: Left;   WISDOM TOOTH EXTRACTION     Social History   Occupational History  Not on file  Tobacco Use   Smoking status: Never   Smokeless tobacco: Never  Vaping Use   Vaping Use: Never used  Substance and Sexual Activity   Alcohol use: No    Alcohol/week: 0.0 standard drinks of alcohol   Drug use: No   Sexual activity: Yes    Birth control/protection: Surgical

## 2022-09-25 ENCOUNTER — Ambulatory Visit: Payer: BC Managed Care – PPO | Admitting: Orthopedic Surgery

## 2022-09-29 ENCOUNTER — Ambulatory Visit (HOSPITAL_COMMUNITY): Payer: BC Managed Care – PPO | Attending: Cardiology

## 2022-09-29 DIAGNOSIS — R9431 Abnormal electrocardiogram [ECG] [EKG]: Secondary | ICD-10-CM

## 2022-09-29 DIAGNOSIS — I493 Ventricular premature depolarization: Secondary | ICD-10-CM

## 2022-09-29 LAB — ECHOCARDIOGRAM COMPLETE
Area-P 1/2: 2.91 cm2
S' Lateral: 2.5 cm

## 2022-10-03 ENCOUNTER — Telehealth (HOSPITAL_COMMUNITY): Payer: Self-pay | Admitting: Emergency Medicine

## 2022-10-03 NOTE — Telephone Encounter (Signed)
Attempted to call patient regarding upcoming cardiac CT appointment. °Left message on voicemail with name and callback number °Satina Jerrell RN Navigator Cardiac Imaging °[facility] °[phone] Office °[phone] Cell ° °

## 2022-10-03 NOTE — Telephone Encounter (Signed)
Reaching out to patient to offer assistance regarding upcoming cardiac imaging study; pt verbalizes understanding of appt date/time, parking situation and where to check in, pre-test NPO status and medications ordered, and verified current allergies; name and call back number provided for further questions should they arise Marchia Bond RN Navigator Cardiac Imaging [facility] [phone] office [phone] cell  Arrival 800 WC entrance 262m labetolol TID Denies iv issues Aware contrast/nitro

## 2022-10-06 ENCOUNTER — Other Ambulatory Visit: Payer: Self-pay | Admitting: Nurse Practitioner

## 2022-10-06 ENCOUNTER — Ambulatory Visit (HOSPITAL_COMMUNITY)
Admission: RE | Admit: 2022-10-06 | Discharge: 2022-10-06 | Disposition: A | Discharge status: Home / Self Care | Payer: BC Managed Care – PPO | Source: Ambulatory Visit | Attending: Nurse Practitioner | Admitting: Nurse Practitioner

## 2022-10-06 DIAGNOSIS — E669 Obesity, unspecified: Secondary | ICD-10-CM | POA: Diagnosis present

## 2022-10-06 DIAGNOSIS — R072 Precordial pain: Secondary | ICD-10-CM

## 2022-10-06 DIAGNOSIS — E063 Autoimmune thyroiditis: Secondary | ICD-10-CM

## 2022-10-06 DIAGNOSIS — I10 Essential (primary) hypertension: Secondary | ICD-10-CM

## 2022-10-06 DIAGNOSIS — R002 Palpitations: Secondary | ICD-10-CM

## 2022-10-06 LAB — POCT I-STAT CREATININE: Creatinine, Ser: 0.7 mg/dL (ref 0.44–1.00)

## 2022-10-06 LAB — GLUCOSE, CAPILLARY: Glucose-Capillary: 178 mg/dL — ABNORMAL HIGH (ref 70–99)

## 2022-10-06 MED ORDER — NITROGLYCERIN 0.4 MG SL SUBL: 0.8000 mg | SUBLINGUAL_TABLET | Freq: Once | SUBLINGUAL | Status: DC

## 2022-10-06 MED ORDER — METOPROLOL TARTRATE 5 MG/5ML IV SOLN
5.0000 mg | INTRAVENOUS | Status: DC | PRN
  Administered 2022-10-06: 5 mg via INTRAVENOUS

## 2022-10-06 MED ORDER — DILTIAZEM HCL 25 MG/5ML IV SOLN: 5.0000 mg | INTRAVENOUS | Status: DC | PRN

## 2022-10-06 MED ORDER — NITROGLYCERIN 0.4 MG SL SUBL
SUBLINGUAL_TABLET | SUBLINGUAL | Status: AC
  Administered 2022-10-06: 0.4 mg
  Filled 2022-10-06: qty 2

## 2022-10-06 MED ORDER — METOPROLOL TARTRATE 5 MG/5ML IV SOLN
INTRAVENOUS | Status: AC
  Administered 2022-10-06: 5 mg via INTRAVENOUS
  Filled 2022-10-06: qty 5

## 2022-10-06 MED ORDER — DILTIAZEM HCL 25 MG/5ML IV SOLN
INTRAVENOUS | Status: AC
  Administered 2022-10-06: 5 mg
  Filled 2022-10-06: qty 5

## 2022-10-06 NOTE — Progress Notes (Signed)
Patient did take extra 50 mg of Labetolol this am 0730. Heart rate 80's. Tried Metoprolol IV for total 22m. Initially heart rate into 70's but after nitro developed headache and heart rate into high 80's and 90's. Diltiazem 546mineffective. Test was cancelled. Felt unwell on sitting up, funny in the head and vision not right.Returned to nurses station as SBP 96 and feels unwell. BP improved on arrival. Coffee given and CBG done for result of 178. States her sugar is always high in am. Feels much better, almost back to normal. Discharged walking. Will let narratios know test was cancelled.

## 2022-10-06 NOTE — Progress Notes (Signed)
[place] tech will let Cardiac narrators know test was cancelled

## 2022-10-13 ENCOUNTER — Ambulatory Visit (INDEPENDENT_AMBULATORY_CARE_PROVIDER_SITE_OTHER): Payer: BC Managed Care – PPO | Admitting: Orthopedic Surgery

## 2022-10-13 DIAGNOSIS — L02612 Cutaneous abscess of left foot: Secondary | ICD-10-CM | POA: Diagnosis not present

## 2022-10-22 ENCOUNTER — Encounter: Payer: Self-pay | Admitting: Orthopedic Surgery

## 2022-10-22 NOTE — Progress Notes (Signed)
Office Visit Note   Patient: Jordan Reeves           Date of Birth: 06/16/1971           MRN: [id number] Visit Date: 10/13/2022              Requested by: Caryl Bis, MD [place],  [place] [address] PCP: Caryl Bis, MD  Chief Complaint  Patient presents with   Left Foot - Wound Check      HPI: Patient is a 52 year old woman who presents in follow-up for abscess fifth toe left foot.  Discussed that the previous exam recommendation to proceed with a amputation of the fifth toe through the MTP joint.  Patient states she is currently undergoing a cardiac workup.  Assessment & Plan: Visit Diagnoses:  1. Abscess of fifth toe, left     Plan: Patient would like to proceed with surgery in March she will follow-up in 4 weeks.  Follow-Up Instructions: Return in about 4 weeks (around 11/10/2022).   Ortho Exam  Patient is alert, oriented, no adenopathy, well-dressed, normal affect, normal respiratory effort. Examination patient has a palpable dorsalis pedis pulse radiographs show lytic changes to the tuft of the little toe.  She has sausage digit swelling cellulitis and ulceration.  There is mild tenderness to palpation she has completed antibiotics.  Imaging: No results found. No images are attached to the encounter.  Labs: Lab Results  Component Value Date   HGBA1C 6.7 (H) 11/08/2018   ESRSEDRATE 20 04/11/2018   REPTSTATUS 11/15/2018 FINAL 11/10/2018   GRAMSTAIN  11/10/2018    FEW WBC PRESENT, PREDOMINANTLY MONONUCLEAR NO ORGANISMS SEEN    CULT  11/10/2018    RARE PSEUDOMONAS AERUGINOSA NO ANAEROBES ISOLATED Performed at [facility], [place] [address], [place], [place] [address]    LABORGA PSEUDOMONAS AERUGINOSA 11/10/2018     Lab Results  Component Value Date   ALBUMIN 4.2 04/11/2018   ALBUMIN 4.2 12/29/2017   ALBUMIN 2.5 (L) 10/13/2017    Lab Results  Component Value Date   MG 2.2 08/23/2022   No results found for: "VD25OH"  No  results found for: "PREALBUMIN"    Latest Ref Rng & Units 08/23/2022    8:48 AM 04/11/2018    2:33 AM 12/29/2017    7:43 PM  CBC EXTENDED  WBC 4.0 - 10.5 K/uL 10.7  12.0  8.6   RBC 3.87 - 5.11 MIL/uL 4.92  4.45  4.62   Hemoglobin 12.0 - 15.0 g/dL 14.6  13.3  13.6   HCT 36.0 - 46.0 % 43.2  39.1  39.9   Platelets 150 - 400 K/uL 351  286  279   NEUT# 1.7 - 7.7 K/uL  8.7  5.9   Lymph# 0.7 - 4.0 K/uL  2.1  1.9      There is no height or weight on file to calculate BMI.  Orders:  No orders of the defined types were placed in this encounter.  No orders of the defined types were placed in this encounter.    Procedures: No procedures performed  Clinical Data: No additional findings.  ROS:  All other systems negative, except as noted in the HPI. Review of Systems  Objective: Vital Signs: There were no vitals taken for this visit.  Specialty Comments:  No specialty comments available.  PMFS History: Patient Active Problem List   Diagnosis Date Noted   Claw toe, acquired, left    Achilles  tendon contracture, left    Chronic hypertension complicating or reason for care during pregnancy, third trimester 10/11/2017   History of recurrent miscarriages--x 4 05/01/2017   Diabetes mellitus without complication ([place]) 99991111   AMA (advanced maternal age) multigravida 35+ 05/01/2017   Tachycardia 05/01/2017   Obesity (BMI 30-39.9) 02/05/2016   Benign essential HTN 01/25/2015   PVCs (premature ventricular contractions) 01/25/2015   Heart palpitations 11/21/2014   Thyroiditis    Past Medical History:  Diagnosis Date   Benign essential HTN 01/25/2015   Diabetes mellitus without complication ([place])    insuiln currently   Dysrhythmia    PVCs last EKG NSR 09/2016   Hypertension    Hypothyroidism    Infertility, female    Obesity (BMI 30-39.9) 02/05/2016   PCOS (polycystic ovarian syndrome)    PVCs (premature ventricular contractions) 01/25/2015   Thyroiditis    Hashimoto's now  with hypothyroidism    Family History  Problem Relation Age of Onset   Heart disease Paternal Grandfather    Lymphoma Paternal Grandmother    Heart disease Maternal Grandmother    Heart disease Maternal Grandfather    Heart disease Father    Diabetes Mother    Hypertension Mother    Heart disease Mother        heart murmu   Hyperlipidemia Mother    Breast cancer Maternal Aunt    Cervical cancer Maternal Aunt     Past Surgical History:  Procedure Laterality Date   ACHILLES TENDON LENGTHENING Left 03/22/2019   Procedure: LEFT ACHILLES TENDON Z-LENGTHENING;  Surgeon: Newt Minion, MD;  Location: [facility];  Service: Orthopedics;  Laterality: Left;   AMPUTATION Left 11/10/2018   Procedure: AMPUTATION TOE 4TH;  Surgeon: Tyson Babinski, DPM;  [facility];  Service: Podiatry;  Laterality: Left;   CESAREAN SECTION WITH BILATERAL TUBAL LIGATION N/A 10/12/2017   Procedure: CESAREAN SECTION WITH BILATERAL TUBAL LIGATION;  Surgeon: Delsa Bern, MD;  Location: [facility];  Service: Obstetrics;  Laterality: N/A;   COLONOSCOPY WITH PROPOFOL N/A 07/09/2021   Procedure: COLONOSCOPY WITH PROPOFOL;  Surgeon: Eloise Harman, DO;  [facility];  Service: Endoscopy;  Laterality: N/A;  9:30 / ASA II   POLYPECTOMY  07/09/2021   Procedure: POLYPECTOMY;  Surgeon: Eloise Harman, DO;  [facility];  Service: Endoscopy;;   TONSILLECTOMY AND ADENOIDECTOMY     TUBAL LIGATION     WEIL OSTEOTOMY Left 11/06/2020   Procedure: LEFT FOOT 2ND AND 3RD METATARSAL WEIL OSTEOTOMY, PROXIMAL INTERPHALANGEAL RESECTION 2ND AND 3RD TOES;  Surgeon: Newt Minion, MD;  Location: [facility];  Service: Orthopedics;  Laterality: Left;   WISDOM TOOTH EXTRACTION     Social History   Occupational History   Not on file  Tobacco Use   Smoking status: Never   Smokeless tobacco: Never  Vaping Use   Vaping Use: Never used  Substance and Sexual Activity    Alcohol use: No    Alcohol/week: 0.0 standard drinks of alcohol   Drug use: No   Sexual activity: Yes    Birth control/protection: Surgical

## 2022-11-10 ENCOUNTER — Ambulatory Visit: Payer: BC Managed Care – PPO | Admitting: Orthopedic Surgery

## 2022-11-16 NOTE — Progress Notes (Unsigned)
Office Visit    Patient Name: Jordan Reeves Date of Encounter: 11/16/2022  Primary Care Provider:  Caryl Bis, MD Primary Cardiologist:  None Primary Electrophysiologist: None  Chief Complaint    Jordan Reeves is a 52 y.o. female with PMH of HTN, IDDM type II, PVCs, PCOS, hypothyroidism, obesity, Hashimoto's thyroiditis who presents today for 54-month follow-up.  Past Medical History    Past Medical History:  Diagnosis Date   Benign essential HTN 01/25/2015   Diabetes mellitus without complication ([place])    insuiln currently   Dysrhythmia    PVCs last EKG NSR 09/2016   Hypertension    Hypothyroidism    Infertility, female    Obesity (BMI 30-39.9) 02/05/2016   PCOS (polycystic ovarian syndrome)    PVCs (premature ventricular contractions) 01/25/2015   Thyroiditis    Hashimoto's now with hypothyroidism   Past Surgical History:  Procedure Laterality Date   ACHILLES TENDON LENGTHENING Left 03/22/2019   Procedure: LEFT ACHILLES TENDON Z-LENGTHENING;  Surgeon: Newt Minion, MD;  Location: [place];  Service: Orthopedics;  Laterality: Left;   AMPUTATION Left 11/10/2018   Procedure: AMPUTATION TOE 4TH;  Surgeon: Tyson Babinski, DPM;  Location: [facility];  Service: Podiatry;  Laterality: Left;   CESAREAN SECTION WITH BILATERAL TUBAL LIGATION N/A 10/12/2017   Procedure: CESAREAN SECTION WITH BILATERAL TUBAL LIGATION;  Surgeon: Delsa Bern, MD;  Location: [place];  Service: Obstetrics;  Laterality: N/A;   COLONOSCOPY WITH PROPOFOL N/A 07/09/2021   Procedure: COLONOSCOPY WITH PROPOFOL;  Surgeon: Eloise Harman, DO;  [facility];  Service: Endoscopy;  Laterality: N/A;  9:30 / ASA II   POLYPECTOMY  07/09/2021   Procedure: POLYPECTOMY;  Surgeon: Eloise Harman, DO;  [facility];  Service: Endoscopy;;   TONSILLECTOMY AND ADENOIDECTOMY     TUBAL LIGATION     WEIL OSTEOTOMY Left 11/06/2020   Procedure: LEFT FOOT  2ND AND 3RD METATARSAL WEIL OSTEOTOMY, PROXIMAL INTERPHALANGEAL RESECTION 2ND AND 3RD TOES;  Surgeon: Newt Minion, MD;  Location: [place];  Service: Orthopedics;  Laterality: Left;   WISDOM TOOTH EXTRACTION      Allergies  No Known Allergies  History of Present Illness    Jordan Reeves  is a 52 year old female with the above mention past medical history who presents today for follow-up of recent ED visit for palpitations.  Jordan Reeves recently was seen by Dr. Radford Pax in 2016 for elevated blood pressure and palpitations.  She wore an event monitor that showed sinus rhythm with occasional sinus tach and rare PVCs.  2D echo was completed 01/2015 that showed EF of 60-65% with grade 1 DD and mild LVH.  She underwent nuclear stress test for chest pain that was normal.  She presented to the ED via EMS on 08/23/2022 for complaint of palpitations and bradycardia.  EKG was completed and route that showed PVCs versus second-degree type I [place].  She was consulted by Dr.Croitoru who felt that EKG showed PACs rather than second-degree heart block.  ACS workup was reassuring and negative. She was given 3-day ZIO Holter monitor for evaluation of PACs that is still in progress.  She was seen in follow-up 09/03/2022 and reported no recurrence of syncope or presyncope.  Her blood pressure was elevated at 168/90 and patient had discontinued her Armour Thyroid and adjusted self adjusted her labetalol.  She completed a cardiac CTA that showed mild CAD with calcium score  of 22. She was started on ASA 81 mg and discussion of possible statin will be completed at her follow-up.  Jordan Reeves presents today with her husband and son for 1 month follow-up.  Since last being seen in the office patient reports that she is now experienced any chest pain or sustained palpitations since previous visit.  He is currently medications without any adverse reactions.  She is currently following a PCOS diet which consist of a  set amount of carbs per day.  She is down 8 pounds since previous visit.  She recently had a toe amputated and is currently recovering from procedure.  Her blood pressure today was elevated at 152/74 and was.  Patient denies chest pain, palpitations, dyspnea, PND, orthopnea, nausea, vomiting, dizziness, syncope, edema, weight gain, or early satiety.  Home Medications    Current Outpatient Medications  Medication Sig Dispense Refill   furosemide (LASIX) 20 MG tablet Take 20 mg by mouth daily as needed for fluid.     labetalol (NORMODYNE) 200 MG tablet Take 2 tablets (400 mg total) by mouth 3 (three) times daily. (Patient taking differently: Take 200 mg by mouth 3 (three) times daily.)     levothyroxine (SYNTHROID) 88 MCG tablet Take 88 mcg by mouth daily before breakfast.     metFORMIN (GLUCOPHAGE-XR) 500 MG 24 hr tablet Take 1,000 mg by mouth at bedtime.     Multiple Vitamin (MULTIVITAMIN ADULT PO) Take 1 tablet by mouth daily.     mupirocin ointment (BACTROBAN) 2 % Apply 1 Application topically daily. 22 g 0   NIFEdipine (PROCARDIA-XL/NIFEDICAL-XL) 30 MG 24 hr tablet Take 30 mg by mouth at bedtime.     No current facility-administered medications for this visit.     Review of Systems  Please see the history of present illness.    (+) Left foot pain from surgical procedure (+) Headache  All other systems reviewed and are otherwise negative except as noted above.  Physical Exam    Wt Readings from Last 3 Encounters:  09/03/22 253 lb 3.2 oz (114.9 kg)  08/23/22 240 lb (108.9 kg)  07/09/21 248 lb (112.5 kg)   [id number] were no vitals filed for this visit.,There is no height or weight on file to calculate BMI.  Constitutional:      Appearance: Healthy appearance. Not in distress.  Neck:     Vascular: JVD normal.  Pulmonary:     Effort: Pulmonary effort is normal.     Breath sounds: No wheezing. No rales. Diminished in the bases Cardiovascular:     Normal rate. Regular rhythm.  Normal S1. Normal S2.      Murmurs: There is no murmur.  Edema:    Peripheral edema absent.  Abdominal:     Palpations: Abdomen is soft non tender. There is no hepatomegaly.  Skin:    General: Skin is warm and dry.  Neurological:     General: No focal deficit present.     Mental Status: Alert and oriented to person, place and time.     Cranial Nerves: Cranial nerves are intact.  EKG/LABS/ Recent Cardiac Studies    ECG personally reviewed by me today -none completed today   Lab Results  Component Value Date   WBC 10.7 (H) 08/23/2022   HGB 14.6 08/23/2022   HCT 43.2 08/23/2022   MCV 87.8 08/23/2022   PLT 351 08/23/2022   Lab Results  Component Value Date   CREATININE 0.70 10/06/2022   BUN 13 08/23/2022  NA 137 08/23/2022   K 4.2 08/23/2022   CL 105 08/23/2022   CO2 21 (L) 08/23/2022   Lab Results  Component Value Date   ALT 14 04/11/2018   AST 14 (L) 04/11/2018   ALKPHOS 58 04/11/2018   BILITOT 0.5 04/11/2018   No results found for: "CHOL", "HDL", "LDLCALC", "LDLDIRECT", "TRIG", "CHOLHDL"  Lab Results  Component Value Date   HGBA1C 6.7 (H) 11/08/2018    Cardiac Studies & Procedures     STRESS TESTS  MYOCARDIAL PERFUSION IMAGING 02/28/2015  Narrative  Nuclear stress EF: 67%.  This is a low risk study.  The left ventricular ejection fraction is hyperdynamic (>65%).  No ischemia identified   ECHOCARDIOGRAM  ECHOCARDIOGRAM COMPLETE 09/29/2022  Narrative ECHOCARDIOGRAM REPORT    Patient Name:   Jordan Reeves Date of Exam: 09/29/2022 Medical Rec #:  [id number]           Height:       68.0 in Accession #:    [id number]          Weight:       253.2 lb Date of Birth:  1971-01-29            BSA:          2.259 m Patient Age:    89 years            BP:           168/90 mmHg Patient Gender: F                   HR:           68 bpm. Exam Location:  [address]  Procedure: 2D Echo, Cardiac Doppler and Color Doppler  Indications:    R94.31 Abnormal  EKG  History:        Patient has prior history of Echocardiogram examinations, most recent 02/02/2015. Abnormal ECG, Arrythmias:PVC; Risk Factors:Obesity, Diabetes and Hypertension.  Sonographer:    Coralyn Helling RDCS Referring Phys: [place]   1. Left ventricular ejection fraction, by estimation, is 60 to 65%. The left ventricle has normal function. The left ventricle has no regional wall motion abnormalities. There is moderate left ventricular hypertrophy of the septal segment. Left ventricular diastolic parameters are consistent with Grade I diastolic dysfunction (impaired relaxation). 2. Right ventricular systolic function is normal. The right ventricular size is normal. 3. A small pericardial effusion is present. The pericardial effusion is circumferential. 4. The mitral valve is normal in structure. Mild mitral valve regurgitation. No evidence of mitral stenosis. 5. The aortic valve is tricuspid. Aortic valve regurgitation is not visualized. No aortic stenosis is present. 6. The inferior vena cava is normal in size with greater than 50% respiratory variability, suggesting right atrial pressure of 3 mmHg.  FINDINGS Left Ventricle: Left ventricular ejection fraction, by estimation, is 60 to 65%. The left ventricle has normal function. The left ventricle has no regional wall motion abnormalities. The left ventricular internal cavity size was normal in size. There is moderate left ventricular hypertrophy of the septal segment. Left ventricular diastolic parameters are consistent with Grade I diastolic dysfunction (impaired relaxation).  Right Ventricle: The right ventricular size is normal. No increase in right ventricular wall thickness. Right ventricular systolic function is normal.  Left Atrium: Left atrial size was normal in size.  Right Atrium: Right atrial size was normal in size.  Pericardium: A small pericardial effusion is present. The pericardial  effusion is circumferential.  Mitral Valve: The mitral valve is normal in structure. Mild mitral valve regurgitation. No evidence of mitral valve stenosis.  Tricuspid Valve: The tricuspid valve is normal in structure. Tricuspid valve regurgitation is not demonstrated. No evidence of tricuspid stenosis.  Aortic Valve: The aortic valve is tricuspid. Aortic valve regurgitation is not visualized. No aortic stenosis is present.  Pulmonic Valve: The pulmonic valve was normal in structure. Pulmonic valve regurgitation is trivial. No evidence of pulmonic stenosis.  Aorta: The aortic root is normal in size and structure.  Venous: The inferior vena cava is normal in size with greater than 50% respiratory variability, suggesting right atrial pressure of 3 mmHg.  IAS/Shunts: No atrial level shunt detected by color flow Doppler.   LEFT VENTRICLE PLAX 2D LVIDd:         4.30 cm   Diastology LVIDs:         2.50 cm   LV e' medial:    5.77 cm/s LV PW:         1.00 cm   LV E/e' medial:  17.3 LV IVS:        1.50 cm   LV e' lateral:   8.59 cm/s LVOT diam:     2.00 cm   LV E/e' lateral: 11.6 LV SV:         78 LV SV Index:   35 LVOT Area:     3.14 cm   RIGHT VENTRICLE             IVC RV S prime:     17.50 cm/s  IVC diam: 1.40 cm TAPSE (M-mode): 1.6 cm RVSP:           23.4 mmHg  LEFT ATRIUM             Index        RIGHT ATRIUM           Index LA diam:        4.70 cm 2.08 cm/m   RA Pressure: 3.00 mmHg LA Vol (A2C):   54.1 ml 23.95 ml/m  RA Area:     11.20 cm LA Vol (A4C):   68.0 ml 30.10 ml/m  RA Volume:   22.20 ml  9.83 ml/m LA Biplane Vol: 60.8 ml 26.91 ml/m AORTIC VALVE LVOT Vmax:   102.00 cm/s LVOT Vmean:  76.200 cm/s LVOT VTI:    0.249 m  AORTA Ao Root diam: 3.50 cm Ao Asc diam:  3.60 cm  MITRAL VALVE                TRICUSPID VALVE MV Area (PHT): 2.91 cm     TR Peak grad:   20.4 mmHg MV Decel Time: 261 msec     TR Vmax:        226.00 cm/s MV E velocity: 100.00 cm/s  Estimated  RAP:  3.00 mmHg MV A velocity: 95.90 cm/s   RVSP:           23.4 mmHg MV E/A ratio:  1.04 SHUNTS Systemic VTI:  0.25 m Systemic Diam: 2.00 cm  Candee Furbish MD Electronically signed by Candee Furbish MD Signature Date/Time: 09/29/2022/4:56:25 PM    Final    MONITORS  LONG TERM MONITOR (3-14 DAYS) 09/04/2022  Narrative   Predominant rhythm was normal sinus rhythm with average heart rate 73 bpm and ranged from 54 to 117 bpm   Rare PAC and PVC corresponding to patient's symptoms of palpitations   Patch Wear Time:  3 days and 0  hours (2024-01-04T18:08:20-0500 to 2024-01-07T18:52:46-0500)  Patient had a min HR of 54 bpm, max HR of 117 bpm, and avg HR of 73 bpm. Predominant underlying rhythm was Sinus Rhythm. Isolated SVEs were rare (<1.0%), and no SVE Couplets or SVE Triplets were present. Isolated VEs were rare (<1.0%), and no VE Couplets or VE Triplets were present.           Assessment & Plan   1. Essential hypertension: -Patient's blood pressure today was elevated at 152/74 and recheck was 144/80.   -Continue labetalol and nifedipine    2.  Palpitations: -Today patient reports palpitations have improved and occur very infrequently. -She denies any presyncope or syncope since previous visit.   3.  Precordial chest pain: -Today patient reports no recurrence of chest discomfort since previous visit.  She obtained a cardiac CTA but was unable to complete test due to elevated heart rate.    4.  Obesity: -Patient's BMI is 36.49 kg/m  -She is currently following a PCOS diet and has lost 8 pounds.   5. History of Hashimoto's thyroiditis: -Patient self discontinued Armour Thyroid and initiated Synthroid 88 mcg -She has been followed closely by PCP regarding medications and thyroid function.     Disposition: Follow-up with None or APP in 12 months   Medication Adjustments/Labs and Tests Ordered: Current medicines are reviewed at length with the patient today.  Concerns  regarding medicines are outlined above.   Signed, Mable Fill, Marissa Nestle, NP 11/16/2022, 8:36 AM [facility]  Note:  This document was prepared using Dragon voice recognition software and may include unintentional dictation errors.

## 2022-11-17 ENCOUNTER — Ambulatory Visit: Payer: BC Managed Care – PPO | Attending: Nurse Practitioner | Admitting: Nurse Practitioner

## 2022-11-17 ENCOUNTER — Encounter: Payer: Self-pay | Admitting: Nurse Practitioner

## 2022-11-17 VITALS — BP 144/80 | HR 99 | Ht 68.0 in | Wt 250.0 lb

## 2022-11-17 DIAGNOSIS — I10 Essential (primary) hypertension: Secondary | ICD-10-CM | POA: Diagnosis not present

## 2022-11-17 DIAGNOSIS — E669 Obesity, unspecified: Secondary | ICD-10-CM | POA: Diagnosis not present

## 2022-11-17 DIAGNOSIS — R002 Palpitations: Secondary | ICD-10-CM

## 2022-11-17 DIAGNOSIS — E069 Thyroiditis, unspecified: Secondary | ICD-10-CM | POA: Diagnosis not present

## 2022-11-17 DIAGNOSIS — R079 Chest pain, unspecified: Secondary | ICD-10-CM

## 2022-11-17 NOTE — Patient Instructions (Signed)
Medication Instructions:  Your physician recommends that you continue on your current medications as directed. Please refer to the Current Medication list given to you today. *If you need a refill on your cardiac medications before your next appointment, please call your pharmacy*   Lab Work: None ordered   Testing/Procedures: None ordered   Follow-Up: At [facility], you and your health needs are our priority.  As part of our continuing mission to provide you with exceptional heart care, we have created designated Provider Care Teams.  These Care Teams include your primary Cardiologist (physician) and Advanced Practice Providers (APPs -  Physician Assistants and Nurse Practitioners) who all work together to provide you with the care you need, when you need it.  We recommend signing up for the patient portal called "MyChart".  Sign up information is provided on this After Visit Summary.  MyChart is used to connect with patients for Virtual Visits (Telemedicine).  Patients are able to view lab/test results, encounter notes, upcoming appointments, etc.  Non-urgent messages can be sent to your provider as well.   To learn more about what you can do with MyChart, go to [email or web address].    Your next appointment:   12 month(s)  Provider:   Fransico Him, MD   Other Instructions

## 2022-12-11 ENCOUNTER — Other Ambulatory Visit: Payer: Self-pay

## 2022-12-11 ENCOUNTER — Encounter (HOSPITAL_COMMUNITY): Payer: Self-pay | Admitting: Emergency Medicine

## 2022-12-11 DIAGNOSIS — Z7982 Long term (current) use of aspirin: Secondary | ICD-10-CM | POA: Diagnosis not present

## 2022-12-11 DIAGNOSIS — M7989 Other specified soft tissue disorders: Secondary | ICD-10-CM | POA: Insufficient documentation

## 2022-12-11 DIAGNOSIS — Z4800 Encounter for change or removal of nonsurgical wound dressing: Secondary | ICD-10-CM | POA: Insufficient documentation

## 2022-12-11 LAB — COMPREHENSIVE METABOLIC PANEL
ALT: 19 U/L (ref 0–44)
AST: 19 U/L (ref 15–41)
Albumin: 4.3 g/dL (ref 3.5–5.0)
Alkaline Phosphatase: 70 U/L (ref 38–126)
Anion gap: 11 (ref 5–15)
BUN: 12 mg/dL (ref 6–20)
CO2: 22 mmol/L (ref 22–32)
Calcium: 9 mg/dL (ref 8.9–10.3)
Chloride: 101 mmol/L (ref 98–111)
Creatinine, Ser: 0.74 mg/dL (ref 0.44–1.00)
GFR, Estimated: >60 mL/min (ref >60)
Glucose, Bld: 178 mg/dL — ABNORMAL HIGH (ref 70–99)
Potassium: 3.6 mmol/L (ref 3.5–5.1)
Sodium: 134 mmol/L — ABNORMAL LOW (ref 135–145)
Total Bilirubin: 0.5 mg/dL (ref 0.3–1.2)
Total Protein: 7.7 g/dL (ref 6.5–8.1)

## 2022-12-11 LAB — CBC WITH DIFFERENTIAL/PLATELET
Abs Immature Granulocytes: 0.07 10*3/uL (ref 0.00–0.07)
Basophils Absolute: 0.1 10*3/uL (ref 0.0–0.1)
Basophils Relative: 1 %
Eosinophils Absolute: 0.4 10*3/uL (ref 0.0–0.5)
Eosinophils Relative: 3 %
HCT: 41.2 % (ref 36.0–46.0)
Hemoglobin: 14 g/dL (ref 12.0–15.0)
Immature Granulocytes: 1 %
Lymphocytes Relative: 16 %
Lymphs Abs: 2.4 10*3/uL (ref 0.7–4.0)
MCH: 29.6 pg (ref 26.0–34.0)
MCHC: 34 g/dL (ref 30.0–36.0)
MCV: 87.1 fL (ref 80.0–100.0)
Monocytes Absolute: 0.9 10*3/uL (ref 0.1–1.0)
Monocytes Relative: 6 %
Neutro Abs: 11.1 10*3/uL — ABNORMAL HIGH (ref 1.7–7.7)
Neutrophils Relative %: 73 %
Platelets: 324 10*3/uL (ref 150–400)
RBC: 4.73 MIL/uL (ref 3.87–5.11)
RDW: 14.1 % (ref 11.5–15.5)
WBC: 14.9 10*3/uL — ABNORMAL HIGH (ref 4.0–10.5)
nRBC: 0 % (ref 0.0–0.2)

## 2022-12-11 LAB — LACTIC ACID, PLASMA: Lactic Acid, Venous: 1.1 mmol/L (ref 0.5–1.9)

## 2022-12-11 LAB — PROTIME-INR
INR: 1 (ref 0.8–1.2)
Prothrombin Time: 12.9 seconds (ref 11.4–15.2)

## 2022-12-11 LAB — CULTURE, BLOOD (ROUTINE X 2)

## 2022-12-11 NOTE — ED Triage Notes (Signed)
Pt arrive POV c/o infection to surgical site where she had left 4th toe removed due to osteomyelitis. Pts original surgery was 6 weeks ago. Pt has had 3 different abx since surgery for post op infection.

## 2022-12-12 ENCOUNTER — Emergency Department (HOSPITAL_COMMUNITY)
Admission: EM | Admit: 2022-12-12 | Discharge: 2022-12-12 | Disposition: A | Discharge status: Home / Self Care | Payer: BC Managed Care – PPO | Attending: Emergency Medicine | Admitting: Emergency Medicine

## 2022-12-12 ENCOUNTER — Emergency Department (HOSPITAL_COMMUNITY): Payer: BC Managed Care – PPO

## 2022-12-12 DIAGNOSIS — Z5189 Encounter for other specified aftercare: Secondary | ICD-10-CM

## 2022-12-12 LAB — URINALYSIS, ROUTINE W REFLEX MICROSCOPIC
Bilirubin Urine: NEGATIVE
Glucose, UA: NEGATIVE mg/dL
Hgb urine dipstick: NEGATIVE
Ketones, ur: 5 mg/dL — AB
Leukocytes,Ua: NEGATIVE
Nitrite: NEGATIVE
Protein, ur: NEGATIVE mg/dL
Specific Gravity, Urine: 1.006 (ref 1.005–1.030)
pH: 5 (ref 5.0–8.0)

## 2022-12-12 LAB — CULTURE, BLOOD (ROUTINE X 2)
Special Requests: ADEQUATE
Special Requests: ADEQUATE

## 2022-12-12 MED ORDER — CEPHALEXIN 500 MG PO CAPS: 500.0000 mg | ORAL_CAPSULE | Freq: Four times a day (QID) | ORAL | 0 refills | Status: AC

## 2022-12-12 MED ORDER — CEPHALEXIN 500 MG PO CAPS
1000.0000 mg | ORAL_CAPSULE | Freq: Once | ORAL | Status: AC
  Administered 2022-12-12: 1000 mg via ORAL
  Filled 2022-12-12: qty 2

## 2022-12-12 NOTE — ED Provider Notes (Signed)
[facility] DEPARTMENT AT [facility] Provider Note   CSN: [id number] Arrival date & time: 12/11/22  2156     History  Chief Complaint  Patient presents with   Wound Check    Jordan Reeves is a 52 y.o. female.  52 year old female that presents ER today secondary to worsening pain and swelling to her left foot.  Patient had 1/5 digit amputation a while back but then had a fourth digit amputation done about 6 weeks ago.  Initially on antibiotics for couple weeks and was doing fine.  Then she noticed swelling and pain and redness so she has started on doxycycline.  It improved again but then the last couple days she has had more swelling again and at some point she felt some type of hard sharp foreign body that she got out of there.  She states that tonight she was in the shower and when she got out she was squeezing a little bit and the removed what sounds like a small area and some fluid shot out.  She squeezed it multiple times and every time more fluids shot out.  She called her podiatrist's office who told her to come to the ER to be evaluated.  She states overall it appears to be looking better the redness is improved.  She is afebrile with no other symptoms such as malaise, nausea, vomiting.   Wound Check       Home Medications Prior to Admission medications   Medication Sig Start Date End Date Taking? Authorizing Provider  cephALEXin (KEFLEX) 500 MG capsule Take 1 capsule (500 mg total) by mouth 4 (four) times daily. 12/12/22  Yes Nakoa Ganus, Barbara Cower, MD  aspirin EC 81 MG tablet Take 81 mg by mouth daily.    [provider]  furosemide (LASIX) 20 MG tablet Take 20 mg by mouth daily as needed for fluid. 04/22/21   [provider]  labetalol (NORMODYNE) 200 MG tablet Take 2 tablets (400 mg total) by mouth 3 (three) times daily. Patient taking differently: Take 200 mg by mouth 3 (three) times daily. 04/29/17   Dunn, Tacey Ruiz, PA-C  levothyroxine  (SYNTHROID) 88 MCG tablet Take 88 mcg by mouth daily before breakfast.    [provider]  metFORMIN (GLUCOPHAGE-XR) 500 MG 24 hr tablet Take 1,000 mg by mouth at bedtime. 05/24/21   [provider]  Multiple Vitamin (MULTIVITAMIN ADULT PO) Take 1 tablet by mouth daily.    [provider]  mupirocin ointment (BACTROBAN) 2 % Apply 1 Application topically daily. 08/13/22   Adonis Huguenin, NP  NIFEdipine (PROCARDIA-XL/NIFEDICAL-XL) 30 MG 24 hr tablet Take 30 mg by mouth at bedtime. 03/14/21   [provider]      Allergies    Patient has no known allergies.    Review of Systems   Review of Systems  Physical Exam Updated Vital Signs BP (!) 154/130   Pulse 95   Temp 99.4 F (37.4 C) (Oral)   Resp 18   Ht  (1.727 m)   Wt 113.4 kg   SpO2 99%   BMI 38.01 kg/m  Physical Exam Vitals and nursing note reviewed.  Constitutional:      Appearance: She is well-developed.  HENT:     Head: Normocephalic and atraumatic.     Mouth/Throat:     Mouth: Mucous membranes are moist.     Pharynx: Oropharynx is clear.  Eyes:     Pupils: Pupils are equal, round, and  reactive to light.  Cardiovascular:     Rate and Rhythm: Normal rate and regular rhythm.  Pulmonary:     Effort: No respiratory distress.     Breath sounds: No stridor.  Abdominal:     General: Abdomen is flat. There is no distension.  Musculoskeletal:     Cervical back: Normal range of motion.  Skin:    Findings: Erythema (over teh 4/5 digit amputation site on left foot. no fluctuance, open wound on end.) present.  Neurological:     General: No focal deficit present.     Mental Status: She is alert.     ED Results / Procedures / Treatments   Labs (all labs ordered are listed, but only abnormal results are displayed) Labs Reviewed  COMPREHENSIVE METABOLIC PANEL - Abnormal; Notable for the following components:      Result Value   Sodium 134 (*)    Glucose, Bld 178 (*)    All other  components within normal limits  CBC WITH DIFFERENTIAL/PLATELET - Abnormal; Notable for the following components:   WBC 14.9 (*)    Neutro Abs 11.1 (*)    All other components within normal limits  URINALYSIS, ROUTINE W REFLEX MICROSCOPIC - Abnormal; Notable for the following components:   Color, Urine STRAW (*)    Ketones, ur 5 (*)    All other components within normal limits  CULTURE, BLOOD (ROUTINE X 2)  CULTURE, BLOOD (ROUTINE X 2)  LACTIC ACID, PLASMA  PROTIME-INR    EKG None  Radiology DG Foot Complete Left  Result Date: 12/12/2022 CLINICAL DATA:  Postop osteomyelitis EXAM: LEFT FOOT - COMPLETE 3+ VIEW COMPARISON:  12/04/2020 FINDINGS: 4th and 5th digit amputation at the MTP joint. Overlying soft tissue swelling but without cortical destruction to suggest acute osteomyelitis. Postsurgical changes at the 2nd and 3rd MTP joints. Mild degenerative changes involving the 1st through 3rd digits. Mild dorsal soft tissue swelling. No fracture or dislocation is seen. IMPRESSION: 4th and 5th digit amputation at the MTP joint. Overlying soft tissue swelling but without cortical destruction to suggest acute osteomyelitis. Electronically Signed   By: Charline Bills M.D.   On: 12/12/2022 01:32    Procedures Procedures    Medications Ordered in ED Medications  cephALEXin (KEFLEX) capsule 1,000 mg (has no administration in time range)    ED Course/ Medical Decision Making/ A&P                             Medical Decision Making Amount and/or Complexity of Data Reviewed Labs: ordered. Radiology: ordered.  Risk Prescription drug management.   The way the patient describes the fluid that came out is difficult to tell if this is purulent fluid or serous fluid. The fluid on the bandage appears to be blood tinged serous fluid and does not have an odor. It is abnormal that she has some type of foreign body and there.  X-ray did not show any evidence of osteomyelitis.  I explained to  her that this was not a definitive test that the best test would be an MRI however at this time she is not septic and was seen the reason for admission to the hospital.  Will start on antibiotics and have her follow-up as scheduled with her podiatrist on Monday.  Return here for any new or worsening symptoms.   Final Clinical Impression(s) / ED Diagnoses Final diagnoses:  Visit for wound check    Rx /  DC Orders ED Discharge Orders          Ordered    cephALEXin (KEFLEX) 500 MG capsule  4 times daily        12/12/22 8295              Nari Vannatter, Barbara Cower, MD 12/12/22 [phone]

## 2022-12-13 LAB — CULTURE, BLOOD (ROUTINE X 2): Culture: NO GROWTH

## 2022-12-14 LAB — CULTURE, BLOOD (ROUTINE X 2): Culture: NO GROWTH

## 2023-09-01 ENCOUNTER — Other Ambulatory Visit: Payer: Self-pay | Admitting: Obstetrics and Gynecology

## 2023-09-01 DIAGNOSIS — Z1231 Encounter for screening mammogram for malignant neoplasm of breast: Secondary | ICD-10-CM

## 2023-10-12 ENCOUNTER — Ambulatory Visit
Admission: RE | Admit: 2023-10-12 | Discharge: 2023-10-12 | Disposition: A | Discharge status: Home / Self Care | Payer: BC Managed Care – PPO | Source: Ambulatory Visit | Attending: Obstetrics and Gynecology | Admitting: Obstetrics and Gynecology

## 2023-10-12 DIAGNOSIS — Z1231 Encounter for screening mammogram for malignant neoplasm of breast: Secondary | ICD-10-CM
